# Patient Record
Sex: Female | Born: 1935
Health system: Southern US, Community
[De-identification: ages and names within clinical notes are randomized; demographics above are authoritative.]

## PROBLEM LIST (undated history)

## (undated) DIAGNOSIS — G459 Transient cerebral ischemic attack, unspecified: Secondary | ICD-10-CM

## (undated) DIAGNOSIS — E7800 Pure hypercholesterolemia, unspecified: Secondary | ICD-10-CM

## (undated) DIAGNOSIS — I10 Essential (primary) hypertension: Secondary | ICD-10-CM

## (undated) DIAGNOSIS — K219 Gastro-esophageal reflux disease without esophagitis: Secondary | ICD-10-CM

## (undated) HISTORY — PX: HIP SURGERY: SHX245

## (undated) HISTORY — PX: ABDOMINAL HYSTERECTOMY: SHX81

---

## 2004-12-10 ENCOUNTER — Ambulatory Visit: Payer: Self-pay | Admitting: Internal Medicine

## 2005-01-27 ENCOUNTER — Encounter: Payer: Self-pay | Admitting: Internal Medicine

## 2005-02-08 ENCOUNTER — Encounter: Payer: Self-pay | Admitting: Internal Medicine

## 2005-03-11 ENCOUNTER — Encounter: Payer: Self-pay | Admitting: Internal Medicine

## 2005-12-14 ENCOUNTER — Ambulatory Visit: Payer: Self-pay | Admitting: Internal Medicine

## 2006-12-21 ENCOUNTER — Ambulatory Visit: Payer: Self-pay | Admitting: Internal Medicine

## 2007-12-22 ENCOUNTER — Ambulatory Visit: Payer: Self-pay | Admitting: Internal Medicine

## 2008-12-24 ENCOUNTER — Ambulatory Visit: Payer: Self-pay | Admitting: Internal Medicine

## 2009-08-14 ENCOUNTER — Inpatient Hospital Stay: Payer: Self-pay | Admitting: Surgery

## 2009-10-14 ENCOUNTER — Ambulatory Visit: Payer: Self-pay | Admitting: Ophthalmology

## 2009-10-28 ENCOUNTER — Ambulatory Visit: Payer: Self-pay | Admitting: Ophthalmology

## 2009-12-10 ENCOUNTER — Ambulatory Visit: Payer: Self-pay | Admitting: Ophthalmology

## 2009-12-23 ENCOUNTER — Ambulatory Visit: Payer: Self-pay | Admitting: Ophthalmology

## 2009-12-30 ENCOUNTER — Ambulatory Visit: Payer: Self-pay | Admitting: Internal Medicine

## 2010-05-22 ENCOUNTER — Inpatient Hospital Stay: Payer: Self-pay | Admitting: Internal Medicine

## 2011-02-16 ENCOUNTER — Ambulatory Visit: Payer: Self-pay | Admitting: Internal Medicine

## 2012-03-02 ENCOUNTER — Ambulatory Visit: Payer: Self-pay | Admitting: Internal Medicine

## 2014-08-17 ENCOUNTER — Ambulatory Visit
Admit: 2014-08-17 | Disposition: A | Discharge status: Home / Self Care | Payer: Self-pay | Attending: Internal Medicine | Admitting: Internal Medicine

## 2015-05-31 DIAGNOSIS — I739 Peripheral vascular disease, unspecified: Secondary | ICD-10-CM | POA: Diagnosis not present

## 2015-05-31 DIAGNOSIS — I10 Essential (primary) hypertension: Secondary | ICD-10-CM | POA: Diagnosis not present

## 2015-05-31 DIAGNOSIS — E7800 Pure hypercholesterolemia, unspecified: Secondary | ICD-10-CM | POA: Diagnosis not present

## 2015-05-31 DIAGNOSIS — Z79899 Other long term (current) drug therapy: Secondary | ICD-10-CM | POA: Diagnosis not present

## 2015-05-31 DIAGNOSIS — D519 Vitamin B12 deficiency anemia, unspecified: Secondary | ICD-10-CM | POA: Diagnosis not present

## 2015-05-31 DIAGNOSIS — N390 Urinary tract infection, site not specified: Secondary | ICD-10-CM | POA: Diagnosis not present

## 2015-06-13 DIAGNOSIS — I10 Essential (primary) hypertension: Secondary | ICD-10-CM | POA: Diagnosis not present

## 2015-06-27 DIAGNOSIS — I739 Peripheral vascular disease, unspecified: Secondary | ICD-10-CM | POA: Diagnosis not present

## 2015-06-27 DIAGNOSIS — I10 Essential (primary) hypertension: Secondary | ICD-10-CM | POA: Diagnosis not present

## 2015-08-08 DIAGNOSIS — I10 Essential (primary) hypertension: Secondary | ICD-10-CM | POA: Diagnosis not present

## 2015-08-08 DIAGNOSIS — D519 Vitamin B12 deficiency anemia, unspecified: Secondary | ICD-10-CM | POA: Diagnosis not present

## 2015-08-08 DIAGNOSIS — I739 Peripheral vascular disease, unspecified: Secondary | ICD-10-CM | POA: Diagnosis not present

## 2015-11-27 DIAGNOSIS — Z0000 Encounter for general adult medical examination without abnormal findings: Secondary | ICD-10-CM | POA: Diagnosis not present

## 2015-11-27 DIAGNOSIS — I739 Peripheral vascular disease, unspecified: Secondary | ICD-10-CM | POA: Diagnosis not present

## 2015-11-27 DIAGNOSIS — D519 Vitamin B12 deficiency anemia, unspecified: Secondary | ICD-10-CM | POA: Diagnosis not present

## 2015-11-27 DIAGNOSIS — Z79899 Other long term (current) drug therapy: Secondary | ICD-10-CM | POA: Diagnosis not present

## 2015-11-27 DIAGNOSIS — E7800 Pure hypercholesterolemia, unspecified: Secondary | ICD-10-CM | POA: Diagnosis not present

## 2015-11-27 DIAGNOSIS — K219 Gastro-esophageal reflux disease without esophagitis: Secondary | ICD-10-CM | POA: Diagnosis not present

## 2015-11-27 DIAGNOSIS — E538 Deficiency of other specified B group vitamins: Secondary | ICD-10-CM | POA: Diagnosis not present

## 2015-11-27 DIAGNOSIS — I10 Essential (primary) hypertension: Secondary | ICD-10-CM | POA: Diagnosis not present

## 2016-01-10 DIAGNOSIS — I10 Essential (primary) hypertension: Secondary | ICD-10-CM | POA: Diagnosis not present

## 2016-02-27 DIAGNOSIS — D519 Vitamin B12 deficiency anemia, unspecified: Secondary | ICD-10-CM | POA: Diagnosis not present

## 2016-02-27 DIAGNOSIS — I10 Essential (primary) hypertension: Secondary | ICD-10-CM | POA: Diagnosis not present

## 2016-02-27 DIAGNOSIS — Z79899 Other long term (current) drug therapy: Secondary | ICD-10-CM | POA: Diagnosis not present

## 2016-04-14 DIAGNOSIS — Z961 Presence of intraocular lens: Secondary | ICD-10-CM | POA: Diagnosis not present

## 2016-06-22 DIAGNOSIS — Z79899 Other long term (current) drug therapy: Secondary | ICD-10-CM | POA: Diagnosis not present

## 2016-06-22 DIAGNOSIS — I10 Essential (primary) hypertension: Secondary | ICD-10-CM | POA: Diagnosis not present

## 2016-06-22 DIAGNOSIS — I2510 Atherosclerotic heart disease of native coronary artery without angina pectoris: Secondary | ICD-10-CM | POA: Diagnosis not present

## 2016-06-22 DIAGNOSIS — R42 Dizziness and giddiness: Secondary | ICD-10-CM | POA: Diagnosis not present

## 2016-06-22 DIAGNOSIS — E7800 Pure hypercholesterolemia, unspecified: Secondary | ICD-10-CM | POA: Diagnosis not present

## 2016-09-22 DIAGNOSIS — I10 Essential (primary) hypertension: Secondary | ICD-10-CM | POA: Diagnosis not present

## 2016-09-22 DIAGNOSIS — E7800 Pure hypercholesterolemia, unspecified: Secondary | ICD-10-CM | POA: Diagnosis not present

## 2016-09-22 DIAGNOSIS — D519 Vitamin B12 deficiency anemia, unspecified: Secondary | ICD-10-CM | POA: Diagnosis not present

## 2016-12-23 DIAGNOSIS — Z79899 Other long term (current) drug therapy: Secondary | ICD-10-CM | POA: Diagnosis not present

## 2016-12-23 DIAGNOSIS — I739 Peripheral vascular disease, unspecified: Secondary | ICD-10-CM | POA: Diagnosis not present

## 2016-12-23 DIAGNOSIS — E7800 Pure hypercholesterolemia, unspecified: Secondary | ICD-10-CM | POA: Diagnosis not present

## 2016-12-23 DIAGNOSIS — K219 Gastro-esophageal reflux disease without esophagitis: Secondary | ICD-10-CM | POA: Diagnosis not present

## 2016-12-23 DIAGNOSIS — I10 Essential (primary) hypertension: Secondary | ICD-10-CM | POA: Diagnosis not present

## 2017-01-11 DIAGNOSIS — N390 Urinary tract infection, site not specified: Secondary | ICD-10-CM | POA: Diagnosis not present

## 2017-01-11 DIAGNOSIS — R300 Dysuria: Secondary | ICD-10-CM | POA: Diagnosis not present

## 2017-01-26 DIAGNOSIS — R300 Dysuria: Secondary | ICD-10-CM | POA: Diagnosis not present

## 2017-01-27 DIAGNOSIS — R8290 Unspecified abnormal findings in urine: Secondary | ICD-10-CM | POA: Diagnosis not present

## 2017-02-04 ENCOUNTER — Emergency Department: Payer: PPO

## 2017-02-04 ENCOUNTER — Observation Stay
Admission: EM | Admit: 2017-02-04 | Discharge: 2017-02-05 | Disposition: A | Discharge status: Home / Self Care | Payer: PPO | Attending: Internal Medicine | Admitting: Internal Medicine

## 2017-02-04 ENCOUNTER — Encounter: Payer: Self-pay | Admitting: Emergency Medicine

## 2017-02-04 DIAGNOSIS — W19XXXA Unspecified fall, initial encounter: Secondary | ICD-10-CM | POA: Insufficient documentation

## 2017-02-04 DIAGNOSIS — Z961 Presence of intraocular lens: Secondary | ICD-10-CM | POA: Insufficient documentation

## 2017-02-04 DIAGNOSIS — R55 Syncope and collapse: Principal | ICD-10-CM | POA: Insufficient documentation

## 2017-02-04 DIAGNOSIS — E7800 Pure hypercholesterolemia, unspecified: Secondary | ICD-10-CM | POA: Insufficient documentation

## 2017-02-04 DIAGNOSIS — Z23 Encounter for immunization: Secondary | ICD-10-CM | POA: Insufficient documentation

## 2017-02-04 DIAGNOSIS — Z79899 Other long term (current) drug therapy: Secondary | ICD-10-CM | POA: Diagnosis not present

## 2017-02-04 DIAGNOSIS — I10 Essential (primary) hypertension: Secondary | ICD-10-CM | POA: Diagnosis not present

## 2017-02-04 DIAGNOSIS — E785 Hyperlipidemia, unspecified: Secondary | ICD-10-CM | POA: Diagnosis not present

## 2017-02-04 DIAGNOSIS — Z8673 Personal history of transient ischemic attack (TIA), and cerebral infarction without residual deficits: Secondary | ICD-10-CM | POA: Insufficient documentation

## 2017-02-04 DIAGNOSIS — R001 Bradycardia, unspecified: Secondary | ICD-10-CM | POA: Diagnosis not present

## 2017-02-04 DIAGNOSIS — Z7902 Long term (current) use of antithrombotics/antiplatelets: Secondary | ICD-10-CM | POA: Insufficient documentation

## 2017-02-04 HISTORY — DX: Essential (primary) hypertension: I10

## 2017-02-04 HISTORY — DX: Pure hypercholesterolemia, unspecified: E78.00

## 2017-02-04 HISTORY — DX: Transient cerebral ischemic attack, unspecified: G45.9

## 2017-02-04 LAB — BASIC METABOLIC PANEL WITH GFR
Anion gap: 12 (ref 5–15)
BUN: 24 mg/dL — ABNORMAL HIGH (ref 6–20)
CO2: 24 mmol/L (ref 22–32)
Calcium: 9.3 mg/dL (ref 8.9–10.3)
Chloride: 101 mmol/L (ref 101–111)
Creatinine, Ser: 1.34 mg/dL — ABNORMAL HIGH (ref 0.44–1.00)
GFR calc Af Amer: 39 mL/min — ABNORMAL LOW
GFR calc non Af Amer: 34 mL/min — ABNORMAL LOW
Glucose, Bld: 171 mg/dL — ABNORMAL HIGH (ref 65–99)
Potassium: 3.8 mmol/L (ref 3.5–5.1)
Sodium: 137 mmol/L (ref 135–145)

## 2017-02-04 LAB — TROPONIN I
TROPONIN I: 0.03 ng/mL — AB (ref <0.03)
Troponin I: 0.03 ng/mL (ref <0.03)

## 2017-02-04 LAB — URINALYSIS, COMPLETE (UACMP) WITH MICROSCOPIC
BILIRUBIN URINE: NEGATIVE
Bacteria, UA: NONE SEEN
Glucose, UA: NEGATIVE mg/dL
Ketones, ur: NEGATIVE mg/dL
LEUKOCYTES UA: NEGATIVE
Nitrite: NEGATIVE
PH: 7 (ref 5.0–8.0)
Protein, ur: NEGATIVE mg/dL
SPECIFIC GRAVITY, URINE: 1.008 (ref 1.005–1.030)

## 2017-02-04 LAB — CBC
HCT: 37.7 % (ref 35.0–47.0)
Hemoglobin: 12.7 g/dL (ref 12.0–16.0)
MCH: 28.2 pg (ref 26.0–34.0)
MCHC: 33.6 g/dL (ref 32.0–36.0)
MCV: 84.1 fL (ref 80.0–100.0)
PLATELETS: 228 10*3/uL (ref 150–440)
RBC: 4.48 MIL/uL (ref 3.80–5.20)
RDW: 15.1 % — ABNORMAL HIGH (ref 11.5–14.5)
WBC: 12.4 10*3/uL — AB (ref 3.6–11.0)

## 2017-02-04 MED ORDER — PRAVASTATIN SODIUM 40 MG PO TABS
40.0000 mg | ORAL_TABLET | Freq: Every day | ORAL | Status: DC
  Administered 2017-02-04 – 2017-02-05 (×2): 40 mg via ORAL
  Filled 2017-02-04 (×2): qty 1

## 2017-02-04 MED ORDER — HYDROCHLOROTHIAZIDE 12.5 MG PO CAPS
12.5000 mg | ORAL_CAPSULE | Freq: Every day | ORAL | Status: DC
  Administered 2017-02-04 – 2017-02-05 (×2): 12.5 mg via ORAL
  Filled 2017-02-04 (×2): qty 1

## 2017-02-04 MED ORDER — BENAZEPRIL HCL 20 MG PO TABS
20.0000 mg | ORAL_TABLET | Freq: Every day | ORAL | Status: DC
  Administered 2017-02-04 – 2017-02-05 (×2): 20 mg via ORAL
  Filled 2017-02-04 (×2): qty 1

## 2017-02-04 MED ORDER — ACETAMINOPHEN 325 MG PO TABS: 650.0000 mg | ORAL_TABLET | Freq: Four times a day (QID) | ORAL | Status: DC | PRN

## 2017-02-04 MED ORDER — INFLUENZA VAC SPLIT HIGH-DOSE 0.5 ML IM SUSY
0.5000 mL | PREFILLED_SYRINGE | INTRAMUSCULAR | Status: AC
  Administered 2017-02-05: 0.5 mL via INTRAMUSCULAR
  Filled 2017-02-04 (×2): qty 0.5

## 2017-02-04 MED ORDER — ONDANSETRON HCL 4 MG/2ML IJ SOLN: 4.0000 mg | Freq: Four times a day (QID) | INTRAMUSCULAR | Status: DC | PRN

## 2017-02-04 MED ORDER — PANTOPRAZOLE SODIUM 40 MG PO TBEC
40.0000 mg | DELAYED_RELEASE_TABLET | Freq: Every day | ORAL | Status: DC
  Administered 2017-02-04 – 2017-02-05 (×2): 40 mg via ORAL
  Filled 2017-02-04 (×2): qty 1

## 2017-02-04 MED ORDER — ONDANSETRON HCL 4 MG PO TABS: 4.0000 mg | ORAL_TABLET | Freq: Four times a day (QID) | ORAL | Status: DC | PRN

## 2017-02-04 MED ORDER — HYDRALAZINE HCL 20 MG/ML IJ SOLN
10.0000 mg | Freq: Four times a day (QID) | INTRAMUSCULAR | Status: DC | PRN
  Administered 2017-02-04: 10 mg via INTRAVENOUS
  Filled 2017-02-04: qty 1

## 2017-02-04 MED ORDER — CLOPIDOGREL BISULFATE 75 MG PO TABS
75.0000 mg | ORAL_TABLET | Freq: Every day | ORAL | Status: DC
  Administered 2017-02-04: 75 mg via ORAL
  Filled 2017-02-04 (×2): qty 1

## 2017-02-04 MED ORDER — BENAZEPRIL-HYDROCHLOROTHIAZIDE 20-12.5 MG PO TABS: 1.0000 | ORAL_TABLET | Freq: Every day | ORAL | Status: DC

## 2017-02-04 MED ORDER — ACETAMINOPHEN 650 MG RE SUPP: 650.0000 mg | Freq: Four times a day (QID) | RECTAL | Status: DC | PRN

## 2017-02-04 MED ORDER — AMLODIPINE BESYLATE 5 MG PO TABS
5.0000 mg | ORAL_TABLET | Freq: Every day | ORAL | Status: DC
  Administered 2017-02-04 – 2017-02-05 (×2): 5 mg via ORAL
  Filled 2017-02-04 (×2): qty 1

## 2017-02-04 MED ORDER — ENOXAPARIN SODIUM 30 MG/0.3ML ~~LOC~~ SOLN
30.0000 mg | SUBCUTANEOUS | Status: DC
  Administered 2017-02-04: 30 mg via SUBCUTANEOUS
  Filled 2017-02-04: qty 0.3

## 2017-02-04 NOTE — ED Provider Notes (Signed)
[facility] Emergency Department Provider Note  Time seen: 5:03 PM  I have reviewed the triage vital signs and the nursing notes.   HISTORY  Chief Complaint Loss of Consciousness    HPI Teresa Wood is a 83 y.o. female With a past medical history of TIAs, hypertension, presents to the emergency department after a syncopal episode this morning. According to the patient she states she awoke around 2:00 this morning and felt like her heart was beating abnormally. Patient states she cannot really describe what it felt like it was not beating normal per patient. States she went back to sleep however upon awakening around 8:00 this morning she was in the kitchen making her breakfast She remembers is awakening on the ground. He does not recall any chest pain dizziness nausea or shortness of breath. Patient states throughout the day she has been feeling fairly well however when family came over later they stated she needed to go to the emergency department so they brought her here for evaluation. Patient denies any chest pain shortness of breath nausea or diaphoresis at any point. Patient denies any history of syncopal episodes in the past.  Past Medical History:  Diagnosis Date  . Elevated cholesterol   . Hypertension   . TIA (transient ischemic attack)     There are no active problems to display for this patient.   No past surgical history on file.  Prior to Admission medications   Medication Sig Start Date End Date Taking? Authorizing Provider  amLODipine (NORVASC) 5 MG tablet Take 5 mg by mouth daily.   Yes [provider]  benazepril-hydrochlorthiazide (LOTENSIN HCT) 20-12.5 MG tablet Take 1 tablet by mouth daily.   Yes [provider]  clopidogrel (PLAVIX) 75 MG tablet Take 75 mg by mouth daily.   Yes [provider]  pantoprazole (PROTONIX) 40 MG tablet Take 40 mg by mouth daily.   Yes [provider]  pravastatin  (PRAVACHOL) 40 MG tablet Take 40 mg by mouth daily.   Yes [provider]    No Known Allergies  No family history on file.  Social History Social History  Substance Use Topics  . Smoking status: Not on file  . Smokeless tobacco: Not on file  . Alcohol use Not on file    Review of Systems Constitutional: Negative for fever. Cardiovascular: Negative for chest pain.positive for feeling like her heart was beating abnormally this morning. Respiratory: Negative for shortness of breath. Gastrointestinal: Negative for abdominal pain Musculoskeletal: Negative for back pain. Neurological: Negative for headache All other ROS negative  ____________________________________________   PHYSICAL EXAM:  VITAL SIGNS: ED Triage Vitals [02/04/17 1450]  Enc Vitals Group     BP (!) 160/82     Pulse Rate 90     Resp 18     Temp 97.9 F (36.6 C)     Temp Source Oral     SpO2 99 %     Weight 120 lb (54.4 kg)     Height 5\' 1"  (1.549 m)     Head Circumference      Peak Flow      Pain Score      Pain Loc      Pain Edu?      Excl. in [place]?     Constitutional: Alert and oriented. Well appearing and in no distress. Eyes: Normal exam ENT   Head: Normocephalic and atraumatic.   Mouth/Throat: Mucous membranes are moist. Cardiovascular: Normal rate, regular  rhythm. No murmur Respiratory: Normal respiratory effort without tachypnea nor retractions. Breath sounds are clear Gastrointestinal: Soft and nontender. No distention. Musculoskeletal: Nontender with normal range of motion in all extremities. No lower extremity tenderness or edema. Neurologic:  Normal speech and language. No gross focal neurologic deficits  Skin:  Skin is warm, dry and intact.  Psychiatric: Mood and affect are normal.  ____________________________________________    EKG  EKG reviewed and interpreted by myself shows sinus bradycardia at 55 bpm, narrow QRS, left axis deviation, normal intervals with no  concerning ST changes.  ____________________________________________    RADIOLOGY  CT head negative  ____________________________________________   INITIAL IMPRESSION / ASSESSMENT AND PLAN / ED COURSE  Pertinent labs & imaging results that were available during my care of the patient were reviewed by me and considered in my medical decision making (see chart for details).  patient presents to the emergency department with a possible syncopal episode this morning. Differential this time would include syncope due to arrhythmia, syncope due to vasovagal/orthostatic, dehydration, less likely ACS, less likely PE. Patient is not sure if she hit her head or not. Head CT is negative. Patient's labs are largely at her baseline. Overall the patient appears very well. No complaints at this time. Patient is a great historian.however during the examination the patient's heart rate maintains in the 70s or 80s but will drop as low as 40 bpm at times before quickly rebounding back to 70 or 80. Patient denies any symptoms when this occurs however these episodes of bradycardia could have been the cause of the patient's syncope this morning. Patient does take amlodipine which could possibly lead to bradycardia however it is atypical to have such brief episodes of bradycardia. As the patient has no history of syncope previously, believe the patient should be admitted to the hospital for further monitoring and treatment.  ____________________________________________   FINAL CLINICAL IMPRESSION(S) / ED DIAGNOSES  syncope  symptom bradycardia    Harvest Dark, MD 02/04/17 1730

## 2017-02-04 NOTE — ED Notes (Signed)
ED Provider at bedside. 

## 2017-02-04 NOTE — Progress Notes (Signed)
Pharmacist - Prescriber Communication  Enoxaparin dose modified from 40 mg subcutaneously once daily to 30 mg subcutaneously once daily due to CrCl less than 30 mL/min.   Maytal Mijangos A. [place], [place].D., BCPS Clinical Pharmacist 02/04/2017 20:18

## 2017-02-04 NOTE — H&P (Signed)
[place] [facility] [place] NAME: Teresa Wood    MR#:  [id number]  DATE OF BIRTH:  23-May-1923  DATE OF ADMISSION:  02/04/2017  PRIMARY CARE PHYSICIAN: Idelle Crouch, MD   REQUESTING/REFERRING PHYSICIAN: Dr. Harvest Dark  CHIEF COMPLAINT:   Chief Complaint  Patient presents with  . Loss of Consciousness    HISTORY OF PRESENT ILLNESS:  Teresa Wood  is a 83 y.o. female with a known history of Hypertension, previous history of TIAs, hyperlipidemia who presents to the hospital due to a syncopal episode. Patient says that she was in a usual state of health until around 2:00 this morning she woke up and felt like her heart was not beating well. She had no other symptoms of diaphoresis, chest pain, shortness of breath. She was able to fall back asleep. She woke up this morning around 8:00 and was walking in her kitchen when she fell straight to the ground backwards. She cannot recall the events and did not have any prodromal symptoms prior to her fall/syncope. Patient denied any chest pain, shortness of breath, nausea, vomiting, palpitations, diaphoresis, melena, hematochezia or any other associated symptoms presently. While the patient was in the emergency room she was noted to have episodes of bradycardia with heart rates going down to as low in the 30s. Her heart rate would quickly recovered by itself and currently she is in a normal sinus rhythm with heart rates in the 70s to 80s. Hospitalist services were contacted further treatment and evaluation.  PAST MEDICAL HISTORY:   Past Medical History:  Diagnosis Date  . Elevated cholesterol   . Hypertension   . TIA (transient ischemic attack)     PAST SURGICAL HISTORY:  No past surgical history on file.  SOCIAL HISTORY:   Social History  Substance Use Topics  . Smoking status: Not on file  . Smokeless tobacco: Not on file  . Alcohol use Not on file    FAMILY HISTORY:  No family history  on file.  DRUG ALLERGIES:  No Known Allergies  REVIEW OF SYSTEMS:   Review of Systems  Constitutional: Negative for fever and weight loss.  HENT: Negative for congestion, nosebleeds and tinnitus.   Eyes: Negative for blurred vision, double vision and redness.  Respiratory: Negative for cough, hemoptysis and shortness of breath.   Cardiovascular: Negative for chest pain, orthopnea, leg swelling and PND.  Gastrointestinal: Negative for abdominal pain, diarrhea, melena, nausea and vomiting.  Genitourinary: Negative for dysuria, hematuria and urgency.  Musculoskeletal: Negative for falls and joint pain.  Neurological: Negative for dizziness, tingling, sensory change, focal weakness, seizures, weakness and headaches.  Endo/Heme/Allergies: Negative for polydipsia. Does not bruise/bleed easily.  Psychiatric/Behavioral: Negative for depression and memory loss. The patient is not nervous/anxious.     MEDICATIONS AT HOME:   Prior to Admission medications   Medication Sig Start Date End Date Taking? Authorizing Provider  amLODipine (NORVASC) 5 MG tablet Take 5 mg by mouth daily.   Yes [provider]  benazepril-hydrochlorthiazide (LOTENSIN HCT) 20-12.5 MG tablet Take 1 tablet by mouth daily.   Yes [provider]  clopidogrel (PLAVIX) 75 MG tablet Take 75 mg by mouth daily.   Yes [provider]  pantoprazole (PROTONIX) 40 MG tablet Take 40 mg by mouth daily.   Yes [provider]  pravastatin (PRAVACHOL) 40 MG tablet Take 40 mg by mouth daily.   Yes [provider]      VITAL SIGNS:  Blood pressure (!) 197/68, pulse 93, temperature 97.9 F (36.6 C), temperature source Oral, resp. rate 18, height 5\' 1"  (1.549 m), weight 54.4 kg (120 lb), SpO2 97 %.  PHYSICAL EXAMINATION:  Physical Exam  GENERAL:  83 y.o.-year-old patient lying in the bed in no acute distress.  EYES: Pupils equal, round, reactive to light and accommodation. No scleral  icterus. Extraocular muscles intact.  HEENT: Head atraumatic, normocephalic. Oropharynx and nasopharynx clear. No oropharyngeal erythema, moist oral mucosa  NECK:  Supple, no jugular venous distention. No thyroid enlargement, no tenderness.  LUNGS: Normal breath sounds bilaterally, no wheezing, rales, rhonchi. No use of accessory muscles of respiration.  CARDIOVASCULAR: S1, S2 RRR. No murmurs, rubs, gallops, clicks.  ABDOMEN: Soft, nontender, nondistended. Bowel sounds present. No organomegaly or mass.  EXTREMITIES: No pedal edema, cyanosis, or clubbing. + 2 pedal & radial pulses b/l.   NEUROLOGIC: Cranial nerves II through XII are intact. No focal Motor or sensory deficits appreciated b/l PSYCHIATRIC: The patient is alert and oriented x 3.  SKIN: No obvious rash, lesion, or ulcer.   LABORATORY PANEL:   CBC  Recent Labs Lab 02/04/17 1500  WBC 12.4*  HGB 12.7  HCT 37.7  PLT 228   ------------------------------------------------------------------------------------------------------------------  Chemistries   Recent Labs Lab 02/04/17 1500  NA 137  K 3.8  CL 101  CO2 24  GLUCOSE 171*  BUN 24*  CREATININE 1.34*  CALCIUM 9.3   ------------------------------------------------------------------------------------------------------------------  Cardiac Enzymes  Recent Labs Lab 02/04/17 1500  TROPONINI 0.03*   ------------------------------------------------------------------------------------------------------------------  RADIOLOGY:  Ct Head Wo Contrast  Result Date: 02/04/2017 CLINICAL DATA:  Syncopal episode while making coffee this morning. Fell dizzy last night. History of hypertension and hypercholesterolemia. EXAM: CT HEAD WITHOUT CONTRAST TECHNIQUE: Contiguous axial images were obtained from the base of the skull through the vertex without intravenous contrast. COMPARISON:  CT HEAD August 17, 2014 FINDINGS: BRAIN: No intraparenchymal hemorrhage, mass effect nor  midline shift. Old LEFT basal ganglia infarct with ex vacuo dilatation LEFT lateral ventricle, ventricles and sulci are overall normal for patient's age. Patchy to comp supratentorial white matter hypodensities are similar. No abnormal extra-axial fluid collections. Basal cisterns are patent. VASCULAR: Moderate calcific atherosclerosis of the carotid siphons. SKULL: No skull fracture. Moderate temporomandibular osteoarthrosis. Partially imaged atlantooccipital assimilation. No significant scalp soft tissue swelling. SINUSES/ORBITS: The mastoid air-cells and included paranasal sinuses are well-aerated.The included ocular globes and orbital contents are non-suspicious. Status post bilateral ocular lens implants. OTHER: None. IMPRESSION: 1. No acute intracranial process. 2. Old LEFT basal ganglia infarct. Moderate chronic small vessel ischemic disease. Electronically Signed   By: Elon Alas M.D.   On: 02/04/2017 15:37     IMPRESSION AND PLAN:   83 year old female with past medical history of hypertension, hyperlipidemia, his history of TIAs who presents to the hospital after a syncopal episode.  1. Syncope-etiology unclear but suspected to be cardiogenic syncope. Patient did have some relative bradycardia in the ER but it has quickly recovered on its own. Patient is not on any rate controlling meds. -We'll observe on telemetry, cycle her cardiac markers, check echocardiogram. Get cardiology consult. Patient may benefit from a Holter/monitor as an outpatient. -We'll also check orthostatic vital signs.  2. Accelerated hypertension-patient's systolic blood pressures in the ER over 190. I will continue her home medications including amlodipine, benazepril/HCTZ. I will also add some IV hydralazine as needed.  3. History of previous CVA/TIA-continue Plavix, Pravachol.  4. GERD-continue Protonix.  5. Hyperlipidemia-continue Pravachol.    All  the records are reviewed and case discussed with ED  provider. Management plans discussed with the patient, family and they are in agreement.  CODE STATUS: Full code  TOTAL TIME TAKING CARE OF THIS PATIENT: 45 minutes.    Henreitta Leber M.D on 02/04/2017 at 6:37 PM  Between 7am to 6pm - Pager - [phone]  After 6pm go to [email or web address] - password [email or web address] [place] [facility]  Office  [phone]  CC: Primary care physician; Idelle Crouch, MD

## 2017-02-04 NOTE — ED Notes (Signed)
Pt eating dinner tray  Family with pt.   

## 2017-02-04 NOTE — ED Notes (Signed)
Iv started    Unable to void at this time.  Pt alert.  Family with pt.   Pt waiting on admission

## 2017-02-04 NOTE — ED Triage Notes (Signed)
Pt reports she woke up at 0200 today because her heart beat didn't feel normal. Pt reports woke back up around 0800 to fix a cup of coffee and woke up on the floor. Pt doesn't remember what happened. Pt reports recently when she lays down at night she feels dizzy. Pt alert and oriented in triage, speech clear. Denies pain. Pt states she does not know if she hit her head. Pt takes Plavix.

## 2017-02-04 NOTE — ED Notes (Signed)
Report called to crystal rn floor nurse.

## 2017-02-04 NOTE — ED Notes (Signed)
primedoc with pt. 

## 2017-02-04 NOTE — ED Notes (Signed)
Pt reports she passed out this morning at home, came to, laid on the sofa awhile,  And then called family.  Family brought pt to er for eval.  Pt alert.  Lives alone.  Pt denies chest pain, sob.  No n/v/d.  No dizziness.  Denies bloody stools.  Pt alert.  Speech clear.  Family with pt.

## 2017-02-05 ENCOUNTER — Observation Stay: Admit: 2017-02-05 | Payer: PPO

## 2017-02-05 DIAGNOSIS — I495 Sick sinus syndrome: Secondary | ICD-10-CM | POA: Diagnosis not present

## 2017-02-05 DIAGNOSIS — R001 Bradycardia, unspecified: Secondary | ICD-10-CM | POA: Diagnosis not present

## 2017-02-05 DIAGNOSIS — E785 Hyperlipidemia, unspecified: Secondary | ICD-10-CM | POA: Diagnosis not present

## 2017-02-05 DIAGNOSIS — R55 Syncope and collapse: Secondary | ICD-10-CM | POA: Diagnosis not present

## 2017-02-05 DIAGNOSIS — I10 Essential (primary) hypertension: Secondary | ICD-10-CM | POA: Diagnosis not present

## 2017-02-05 LAB — CBC
HEMATOCRIT: 31.3 % — AB (ref 35.0–47.0)
HEMOGLOBIN: 10.9 g/dL — AB (ref 12.0–16.0)
MCH: 28.8 pg (ref 26.0–34.0)
MCHC: 34.8 g/dL (ref 32.0–36.0)
MCV: 83 fL (ref 80.0–100.0)
PLATELETS: 189 10*3/uL (ref 150–440)
RBC: 3.77 MIL/uL — AB (ref 3.80–5.20)
RDW: 15.2 % — ABNORMAL HIGH (ref 11.5–14.5)
WBC: 7.5 10*3/uL (ref 3.6–11.0)

## 2017-02-05 LAB — BASIC METABOLIC PANEL
ANION GAP: 6 (ref 5–15)
BUN: 20 mg/dL (ref 6–20)
CHLORIDE: 107 mmol/L (ref 101–111)
CO2: 26 mmol/L (ref 22–32)
CREATININE: 1.16 mg/dL — AB (ref 0.44–1.00)
Calcium: 8.8 mg/dL — ABNORMAL LOW (ref 8.9–10.3)
GFR calc non Af Amer: 40 mL/min — ABNORMAL LOW (ref >60)
GFR, EST AFRICAN AMERICAN: 46 mL/min — AB (ref >60)
Glucose, Bld: 114 mg/dL — ABNORMAL HIGH (ref 65–99)
POTASSIUM: 3.5 mmol/L (ref 3.5–5.1)
SODIUM: 139 mmol/L (ref 135–145)

## 2017-02-05 LAB — TROPONIN I
Troponin I: 0.03 ng/mL (ref <0.03)
Troponin I: 0.03 ng/mL (ref <0.03)

## 2017-02-05 NOTE — Discharge Instructions (Signed)
Do not take Plavix(Clopidogrel) till your Pacemaker is placed.

## 2017-02-05 NOTE — Care Management Obs Status (Signed)
[facility] NOTIFICATION   Patient Details  Name: THURLEY FRANCESCONI MRN: [id number] Date of Birth: 06-01-1923   Medicare Observation Status Notification Given:  Yes Notice signed, one given to patient and the other to HIM for scanning    Katrina Stack, RN 02/05/2017, 9:42 AM

## 2017-02-05 NOTE — Progress Notes (Signed)
Patient is discharge home in a stable condition, summary and f/u care given , verbalized understanding , left with daughter  

## 2017-02-05 NOTE — Consult Note (Signed)
[facility] Card[facility]  CARDIOLOGY CONSULT NOTE  Patient ID: Teresa Wood MRN: [id number] DOB/AGE: Apr 08, 1924 83 y.o.  Admit date: 02/04/2017 Referring Physician Hillary Bow, MD Primary Physician Fulton Reek, MD Primary Cardiologist Isaias Cowman, MD Reason for Consultation Bradycardia  HPI: Teresa Wood is a 83 year old female presenting for evaluation of syncopal episode. Patient has a history of sick sinus syndrome with bradycardia, TIA, hypertension, and hyperlipidemia. The patient states that around 8:00 yesterday morning, she was standing at the kitchen sink and fell backwards onto the floor. She stayed on the floor for no longer than an hour. When she awoke, she was able to get up and go about her daily activities. She denies hitting her head and denies any warning signs including dyspnea, lightheadedness, diaphoresis, chest pain, or palpitations. Around 2:00 that afternoon, she was taken by family members to the walk-in clinic where she was evaluated and advised to go to the emergency room. Patient states that prior to this episode she was in good health. She regularly sees her PCP but has not seen a cardiologist since 2016. The patient reports 1-2 syncopal episodes in the past, most recently occurring about a year ago, but none of which was evaluated by provider. Previous Holter monitor 09/28/2014 revealed predominant sinus bradycardia with a mean heart rate of 49 BPM, minimum 33 BPM and a maximal 96 BPM. Patient was reluctant to proceed with permanent pacemaker implantation at that time.  Upon evaluation in the emergency room, EKG showed sinus bradycardia at a rate of 55 bpm. Labs were pertinent for troponin of 0.03, hemoglobin 12.7 and hematocrit of 37.7 which has decreased to 10.9 and 31.3 respectively. Head CT showed evidence of old TIA but no acute findings. Currently, the patient reports feeling well with no complaints; telemetry shows sinus bradycardia 55 bpm.   Review of systems  complete and found to be negative unless listed above     Past Medical History:  Diagnosis Date  . Elevated cholesterol   . Hypertension   . TIA (transient ischemic attack)     No past surgical history on file.  Prescriptions Prior to Admission  Medication Sig Dispense Refill Last Dose  . amLODipine (NORVASC) 5 MG tablet Take 5 mg by mouth daily.   02/04/2017 at 0800  . benazepril-hydrochlorthiazide (LOTENSIN HCT) 20-12.5 MG tablet Take 1 tablet by mouth daily.   02/04/2017 at 0800  . clopidogrel (PLAVIX) 75 MG tablet Take 75 mg by mouth daily.   02/04/2017 at 0800  . pantoprazole (PROTONIX) 40 MG tablet Take 40 mg by mouth daily.   02/04/2017 at 0800  . pravastatin (PRAVACHOL) 40 MG tablet Take 40 mg by mouth daily.   02/03/2017 at [place] History  . Marital status: Widowed    Spouse name: N/A  . Number of children: N/A  . Years of education: N/A   Occupational History  . Not on file.   Social History Main Topics  . Smoking status: Not on file  . Smokeless tobacco: Not on file  . Alcohol use Not on file  . Drug use: Unknown  . Sexual activity: Not on file   Other Topics Concern  . Not on file   Social History Narrative  . No narrative on file    No family history on file.    Review of systems complete and found to be negative unless listed above      PHYSICAL EXAM  General: Well developed, well nourished,  in no acute distress HEENT:  Normocephalic and atramatic Neck:  No JVD. No carotid bruits upon auscultation.  Lungs: Clear bilaterally to auscultation and percussion. Heart: Bradycardic rate. Normal S1 and S2 without gallops or murmurs.  Abdomen: Bowel sounds are positive, abdomen soft and non-tender  Msk:  Back normal. Normal strength and tone for age. Extremities: No clubbing, cyanosis or edema.   Neuro: Alert and oriented X 3. Psych:  Good affect, responds appropriately  Labs:   Lab Results  Component Value Date   WBC 7.5  02/05/2017   HGB 10.9 (L) 02/05/2017   HCT 31.3 (L) 02/05/2017   MCV 83.0 02/05/2017   PLT 189 02/05/2017    Recent Labs Lab 02/05/17 0410  NA 139  K 3.5  CL 107  CO2 26  BUN 20  CREATININE 1.16*  CALCIUM 8.8*  GLUCOSE 114*   Lab Results  Component Value Date   TROPONINI 0.03 ([place]) 02/05/2017   No results found for: CHOL No results found for: HDL No results found for: LDLCALC No results found for: TRIG No results found for: CHOLHDL No results found for: LDLDIRECT    Radiology: Ct Head Wo Contrast  Result Date: 02/04/2017 CLINICAL DATA:  Syncopal episode while making coffee this morning. Fell dizzy last night. History of hypertension and hypercholesterolemia. EXAM: CT HEAD WITHOUT CONTRAST TECHNIQUE: Contiguous axial images were obtained from the base of the skull through the vertex without intravenous contrast. COMPARISON:  CT HEAD August 17, 2014 FINDINGS: BRAIN: No intraparenchymal hemorrhage, mass effect nor midline shift. Old LEFT basal ganglia infarct with ex vacuo dilatation LEFT lateral ventricle, ventricles and sulci are overall normal for patient's age. Patchy to comp supratentorial white matter hypodensities are similar. No abnormal extra-axial fluid collections. Basal cisterns are patent. VASCULAR: Moderate calcific atherosclerosis of the carotid siphons. SKULL: No skull fracture. Moderate temporomandibular osteoarthrosis. Partially imaged atlantooccipital assimilation. No significant scalp soft tissue swelling. SINUSES/ORBITS: The mastoid air-cells and included paranasal sinuses are well-aerated.The included ocular globes and orbital contents are non-suspicious. Status post bilateral ocular lens implants. OTHER: None. IMPRESSION: 1. No acute intracranial process. 2. Old LEFT basal ganglia infarct. Moderate chronic small vessel ischemic disease. Electronically Signed   By: Elon Alas M.D.   On: 02/04/2017 15:37    EKG: Sinus bradycardia 55 bpm, no ST  elevation  ASSESSMENT AND PLAN:  1. Syncope likely due to underlying bradycardia. Patient currently clinically and hemodynamically stable.  Recommendations: 1. Permanent pacemaker implantation. Patient is agreeable to this plan. Hold Plavix for 5 days prior to procedure. Pending patient's clinical course, permanent pacemaker implantation may be performed as outpatient early next week.   Clearnce Sorrel, PA-S The patient was interviewed and examined with direct supervision of Dr. Saralyn Pilar and the plan was reviewed and made and cooperation with him.  Signed: Isaias Cowman MD,PhD, [facility] 02/05/2017, 8:36 AM

## 2017-02-08 DIAGNOSIS — G451 Carotid artery syndrome (hemispheric): Secondary | ICD-10-CM | POA: Diagnosis not present

## 2017-02-08 DIAGNOSIS — I10 Essential (primary) hypertension: Secondary | ICD-10-CM | POA: Diagnosis not present

## 2017-02-08 DIAGNOSIS — I739 Peripheral vascular disease, unspecified: Secondary | ICD-10-CM | POA: Diagnosis not present

## 2017-02-08 DIAGNOSIS — I2510 Atherosclerotic heart disease of native coronary artery without angina pectoris: Secondary | ICD-10-CM | POA: Diagnosis not present

## 2017-02-08 DIAGNOSIS — E7800 Pure hypercholesterolemia, unspecified: Secondary | ICD-10-CM | POA: Diagnosis not present

## 2017-02-08 DIAGNOSIS — I459 Conduction disorder, unspecified: Secondary | ICD-10-CM | POA: Diagnosis not present

## 2017-02-08 DIAGNOSIS — R001 Bradycardia, unspecified: Secondary | ICD-10-CM | POA: Diagnosis not present

## 2017-02-08 DIAGNOSIS — I878 Other specified disorders of veins: Secondary | ICD-10-CM | POA: Diagnosis not present

## 2017-02-08 NOTE — Discharge Summary (Signed)
[facility] NAME: Teresa Wood    MR#:  [id number]  DATE OF BIRTH:  1923/07/14  DATE OF ADMISSION:  02/04/2017 ADMITTING PHYSICIAN: Henreitta Leber, MD  DATE OF DISCHARGE: 02/05/2017 12:27 PM  PRIMARY CARE PHYSICIAN: Idelle Crouch, MD   ADMISSION DIAGNOSIS:  Syncope and collapse [R55] Symptomatic bradycardia [R00.1]  DISCHARGE DIAGNOSIS:  Active Problems:   Syncope   SECONDARY DIAGNOSIS:   Past Medical History:  Diagnosis Date  . Elevated cholesterol   . Hypertension   . TIA (transient ischemic attack)      ADMITTING HISTORY  HISTORY OF PRESENT ILLNESS:  Teresa Wood  is a 83 y.o. female with a known history of Hypertension, previous history of TIAs, hyperlipidemia who presents to the hospital due to a syncopal episode. Patient says that she was in a usual state of health until around 2:00 this morning she woke up and felt like her heart was not beating well. She had no other symptoms of diaphoresis, chest pain, shortness of breath. She was able to fall back asleep. She woke up this morning around 8:00 and was walking in her kitchen when she fell straight to the ground backwards. She cannot recall the events and did not have any prodromal symptoms prior to her fall/syncope. Patient denied any chest pain, shortness of breath, nausea, vomiting, palpitations, diaphoresis, melena, hematochezia or any other associated symptoms presently. While the patient was in the emergency room she was noted to have episodes of bradycardia with heart rates going down to as low in the 30s. Her heart rate would quickly recovered by itself and currently she is in a normal sinus rhythm with heart rates in the 70s to 80s. Hospitalist services were contacted further treatment and evaluation.   HOSPITAL COURSE:   *   Syncope due to symptomatic bradycardia  *  Hypertension  *  History of CVA/TIA   patient was admitted to telemetry floor.  Did  not have any further bradycardic episodes or syncope in the hospital.  Seen by her cardiologist Dr. Ammie Dalton shows.  Recommended pacemaker.  But presently patient is on Plavix and needs to be off that for 5 days.  Cardiology recommended patient stay off Plavix and follow up with them in the office on Monday.  Discussed with patient.  Discharged home in stable condition with follow-up.  CONSULTS OBTAINED:  Treatment Team:  Isaias Cowman, MD  DRUG ALLERGIES:  No Known Allergies  DISCHARGE MEDICATIONS:   Discharge Medication List as of 02/05/2017 11:12 AM    CONTINUE these medications which have NOT CHANGED   Details  amLODipine (NORVASC) 5 MG tablet Take 5 mg by mouth daily., Historical Med    benazepril-hydrochlorthiazide (LOTENSIN HCT) 20-12.5 MG tablet Take 1 tablet by mouth daily., Historical Med    clopidogrel (PLAVIX) 75 MG tablet Take 75 mg by mouth daily., Historical Med    pantoprazole (PROTONIX) 40 MG tablet Take 40 mg by mouth daily., Historical Med    pravastatin (PRAVACHOL) 40 MG tablet Take 40 mg by mouth daily., Historical Med        Today   VITAL SIGNS:  Blood pressure (!) 144/41, pulse (!) 52, temperature 98.6 F (37 C), temperature source Oral, resp. rate 18, height 5\' 1"  (1.549 m), weight 54.4 kg (120 lb), SpO2 97 %.  I/O:  No intake or output data in the 24 hours ending 02/08/17 1445  PHYSICAL EXAMINATION:  Physical Exam  GENERAL:  83  y.o.-year-old patient lying in the bed with no acute distress.  LUNGS: Normal breath sounds bilaterally, no wheezing, rales,rhonchi or crepitation. No use of accessory muscles of respiration.  CARDIOVASCULAR: S1, S2 normal. No murmurs, rubs, or gallops.  ABDOMEN: Soft, non-tender, non-distended. Bowel sounds present. No organomegaly or mass.  NEUROLOGIC: Moves all 4 extremities. PSYCHIATRIC: The patient is alert and oriented x 3.  SKIN: No obvious rash, lesion, or ulcer.   DATA REVIEW:   CBC  Recent Labs Lab  02/05/17 0410  WBC 7.5  HGB 10.9*  HCT 31.3*  PLT 189    Chemistries   Recent Labs Lab 02/05/17 0410  NA 139  K 3.5  CL 107  CO2 26  GLUCOSE 114*  BUN 20  CREATININE 1.16*  CALCIUM 8.8*    Cardiac Enzymes  Recent Labs Lab 02/05/17 0410  TROPONINI 0.03*    Microbiology Results  No results found for this or any previous visit.  RADIOLOGY:  No results found.  Follow up with PCP in 1 week.  Management plans discussed with the patient, family and they are in agreement.  CODE STATUS:  Code Status History    Date Active Date Inactive Code Status Order ID Comments User Context   02/04/2017  8:12 PM 02/05/2017  3:32 PM Full Code [id number]  Henreitta Leber, MD Inpatient    Advance Directive Documentation     Most Recent Value  Type of Advance Directive  Healthcare Power of [place], Living will  Pre-existing out of facility DNR order (yellow form or pink MOST form)  -  "MOST" Form in Place?  -      TOTAL TIME TAKING CARE OF THIS PATIENT ON DAY OF DISCHARGE: more than 30 minutes.   Hillary Bow R M.D on 02/08/2017 at 2:45 PM  Between 7am to 6pm - Pager - [phone]  After 6pm go to [email or web address] - password [email or web address] [facility]  Office  [phone]  CC: Primary care physician; Idelle Crouch, MD  Note: This dictation was prepared with Dragon dictation along with smaller phrase technology. Any transcriptional errors that result from this process are unintentional.

## 2017-02-09 ENCOUNTER — Inpatient Hospital Stay: Payer: PPO

## 2017-02-09 ENCOUNTER — Ambulatory Visit
Admission: RE | Admit: 2017-02-09 | Discharge: 2017-02-10 | Disposition: A | Discharge status: Home / Self Care | Payer: PPO | Source: Ambulatory Visit | Attending: Cardiology | Admitting: Cardiology

## 2017-02-09 ENCOUNTER — Encounter: Payer: Self-pay | Admitting: *Deleted

## 2017-02-09 ENCOUNTER — Inpatient Hospital Stay: Payer: PPO | Admitting: Anesthesiology

## 2017-02-09 ENCOUNTER — Observation Stay: Payer: PPO

## 2017-02-09 ENCOUNTER — Encounter
Admission: RE | Disposition: A | Discharge status: Home / Self Care | Payer: Self-pay | Source: Ambulatory Visit | Attending: Cardiology

## 2017-02-09 DIAGNOSIS — Z8673 Personal history of transient ischemic attack (TIA), and cerebral infarction without residual deficits: Secondary | ICD-10-CM | POA: Diagnosis not present

## 2017-02-09 DIAGNOSIS — E7800 Pure hypercholesterolemia, unspecified: Secondary | ICD-10-CM | POA: Insufficient documentation

## 2017-02-09 DIAGNOSIS — I495 Sick sinus syndrome: Secondary | ICD-10-CM | POA: Diagnosis present

## 2017-02-09 DIAGNOSIS — K219 Gastro-esophageal reflux disease without esophagitis: Secondary | ICD-10-CM | POA: Insufficient documentation

## 2017-02-09 DIAGNOSIS — Z950 Presence of cardiac pacemaker: Secondary | ICD-10-CM | POA: Diagnosis not present

## 2017-02-09 DIAGNOSIS — Z79899 Other long term (current) drug therapy: Secondary | ICD-10-CM | POA: Diagnosis not present

## 2017-02-09 DIAGNOSIS — I10 Essential (primary) hypertension: Secondary | ICD-10-CM | POA: Insufficient documentation

## 2017-02-09 DIAGNOSIS — R001 Bradycardia, unspecified: Secondary | ICD-10-CM | POA: Diagnosis not present

## 2017-02-09 HISTORY — DX: Gastro-esophageal reflux disease without esophagitis: K21.9

## 2017-02-09 HISTORY — PX: PACEMAKER INSERTION: SHX728

## 2017-02-09 LAB — PROTIME-INR
INR: 1.01
PROTHROMBIN TIME: 13.2 s (ref 11.4–15.2)

## 2017-02-09 LAB — APTT: aPTT: 28 seconds (ref 24–36)

## 2017-02-09 SURGERY — INSERTION, CARDIAC PACEMAKER
Anesthesia: General | Laterality: Left | Wound class: Clean

## 2017-02-09 MED ORDER — GENTAMICIN SULFATE 40 MG/ML IJ SOLN
INTRAMUSCULAR | Status: AC
  Filled 2017-02-09: qty 2

## 2017-02-09 MED ORDER — FENTANYL CITRATE (PF) 100 MCG/2ML IJ SOLN: 25.0000 ug | INTRAMUSCULAR | Status: DC | PRN

## 2017-02-09 MED ORDER — PRAVASTATIN SODIUM 40 MG PO TABS
40.0000 mg | ORAL_TABLET | Freq: Every day | ORAL | Status: DC
  Filled 2017-02-09: qty 1

## 2017-02-09 MED ORDER — SODIUM CHLORIDE 0.9 % IR SOLN
Status: DC | PRN
  Administered 2017-02-09: 200 mL

## 2017-02-09 MED ORDER — ACETAMINOPHEN 10 MG/ML IV SOLN
INTRAVENOUS | Status: DC | PRN
  Administered 2017-02-09: 1000 mg via INTRAVENOUS

## 2017-02-09 MED ORDER — LIDOCAINE 1 % OPTIME INJ - NO CHARGE
INTRAMUSCULAR | Status: DC | PRN
  Administered 2017-02-09: 30 mL

## 2017-02-09 MED ORDER — BENAZEPRIL HCL 20 MG PO TABS
20.0000 mg | ORAL_TABLET | Freq: Every day | ORAL | Status: DC
  Filled 2017-02-09 (×2): qty 1

## 2017-02-09 MED ORDER — ACETAMINOPHEN 10 MG/ML IV SOLN
INTRAVENOUS | Status: AC
  Filled 2017-02-09: qty 100

## 2017-02-09 MED ORDER — EPHEDRINE SULFATE 50 MG/ML IJ SOLN
INTRAMUSCULAR | Status: DC | PRN
  Administered 2017-02-09 (×2): 10 mg via INTRAVENOUS

## 2017-02-09 MED ORDER — ONDANSETRON HCL 4 MG/2ML IJ SOLN: 4.0000 mg | Freq: Four times a day (QID) | INTRAMUSCULAR | Status: DC | PRN

## 2017-02-09 MED ORDER — FENTANYL CITRATE (PF) 100 MCG/2ML IJ SOLN
INTRAMUSCULAR | Status: DC | PRN
  Administered 2017-02-09 (×2): 25 ug via INTRAVENOUS

## 2017-02-09 MED ORDER — CEFAZOLIN SODIUM-DEXTROSE 1-4 GM/50ML-% IV SOLN
INTRAVENOUS | Status: AC
  Filled 2017-02-09: qty 50

## 2017-02-09 MED ORDER — CEFAZOLIN SODIUM-DEXTROSE 1-4 GM/50ML-% IV SOLN
1.0000 g | Freq: Once | INTRAVENOUS | Status: AC
  Administered 2017-02-09: 1 g via INTRAVENOUS

## 2017-02-09 MED ORDER — LACTATED RINGERS IV SOLN
INTRAVENOUS | Status: DC
Start: 2017-02-09 — End: 2017-02-09
  Administered 2017-02-09: 13:00:00 via INTRAVENOUS

## 2017-02-09 MED ORDER — AMLODIPINE BESYLATE 5 MG PO TABS
5.0000 mg | ORAL_TABLET | Freq: Every day | ORAL | Status: DC
  Filled 2017-02-09: qty 1

## 2017-02-09 MED ORDER — LIDOCAINE HCL (PF) 2 % IJ SOLN
INTRAMUSCULAR | Status: AC
  Filled 2017-02-09: qty 2

## 2017-02-09 MED ORDER — LACTATED RINGERS IV SOLN
INTRAVENOUS | Status: DC
  Administered 2017-02-09: 13:00:00 via INTRAVENOUS

## 2017-02-09 MED ORDER — EPHEDRINE SULFATE 50 MG/ML IJ SOLN
INTRAMUSCULAR | Status: AC
  Filled 2017-02-09: qty 1

## 2017-02-09 MED ORDER — OXYCODONE HCL 5 MG/5ML PO SOLN: 5.0000 mg | Freq: Once | ORAL | Status: DC | PRN

## 2017-02-09 MED ORDER — PANTOPRAZOLE SODIUM 40 MG PO TBEC
40.0000 mg | DELAYED_RELEASE_TABLET | Freq: Every day | ORAL | Status: DC
  Filled 2017-02-09 (×2): qty 1

## 2017-02-09 MED ORDER — ACETAMINOPHEN 325 MG PO TABS: 325.0000 mg | ORAL_TABLET | ORAL | Status: DC | PRN

## 2017-02-09 MED ORDER — DEXTROSE 5 % IV SOLN
1.0000 g | Freq: Four times a day (QID) | INTRAVENOUS | Status: AC
  Administered 2017-02-09 – 2017-02-10 (×3): 1 g via INTRAVENOUS
  Filled 2017-02-09 (×3): qty 10

## 2017-02-09 MED ORDER — PROPOFOL 10 MG/ML IV BOLUS
INTRAVENOUS | Status: DC | PRN
  Administered 2017-02-09 (×2): 20 mg via INTRAVENOUS
  Administered 2017-02-09: 30 mg via INTRAVENOUS
  Administered 2017-02-09: 20 mg via INTRAVENOUS
  Administered 2017-02-09: 30 mg via INTRAVENOUS
  Administered 2017-02-09 (×2): 20 mg via INTRAVENOUS

## 2017-02-09 MED ORDER — IOPAMIDOL (ISOVUE-300) INJECTION 61%
INTRAVENOUS | Status: DC | PRN
  Administered 2017-02-09: 10 mL via INTRAVENOUS

## 2017-02-09 MED ORDER — FENTANYL CITRATE (PF) 100 MCG/2ML IJ SOLN
INTRAMUSCULAR | Status: AC
  Filled 2017-02-09: qty 2

## 2017-02-09 MED ORDER — OXYCODONE HCL 5 MG PO TABS: 5.0000 mg | ORAL_TABLET | Freq: Once | ORAL | Status: DC | PRN

## 2017-02-09 MED ORDER — PROPOFOL 10 MG/ML IV BOLUS
INTRAVENOUS | Status: AC
  Filled 2017-02-09: qty 20

## 2017-02-09 MED ORDER — HYDROCHLOROTHIAZIDE 12.5 MG PO CAPS
12.5000 mg | ORAL_CAPSULE | Freq: Every day | ORAL | Status: DC
  Filled 2017-02-09: qty 1

## 2017-02-09 SURGICAL SUPPLY — 38 items
BAG DECANTER FOR FLEXI CONT (MISCELLANEOUS) ×3 IMPLANT
BRUSH SCRUB EZ  4% CHG (MISCELLANEOUS) ×2
BRUSH SCRUB EZ 4% CHG (MISCELLANEOUS) ×1 IMPLANT
CABLE SURG 12 DISP A/V CHANNEL (MISCELLANEOUS) ×3 IMPLANT
CANISTER SUCT 1200ML W/VALVE (MISCELLANEOUS) ×3 IMPLANT
CHLORAPREP W/TINT 26ML (MISCELLANEOUS) ×3 IMPLANT
COVER LIGHT HANDLE STERIS (MISCELLANEOUS) ×6 IMPLANT
COVER MAYO STAND STRL (DRAPES) ×3 IMPLANT
DRAPE C-ARM XRAY 36X54 (DRAPES) ×3 IMPLANT
DRSG TEGADERM 4X4.75 (GAUZE/BANDAGES/DRESSINGS) ×3 IMPLANT
DRSG TELFA 4X3 1S NADH ST (GAUZE/BANDAGES/DRESSINGS) ×3 IMPLANT
ELECT REM PT RETURN 9FT ADLT (ELECTROSURGICAL) ×3
ELECTRODE REM PT RTRN 9FT ADLT (ELECTROSURGICAL) ×1 IMPLANT
GLOVE BIO SURGEON STRL SZ7.5 (GLOVE) ×12 IMPLANT
GLOVE BIO SURGEON STRL SZ8 (GLOVE) ×9 IMPLANT
GOWN STRL REUS W/ TWL LRG LVL3 (GOWN DISPOSABLE) ×2 IMPLANT
GOWN STRL REUS W/ TWL XL LVL3 (GOWN DISPOSABLE) ×1 IMPLANT
GOWN STRL REUS W/TWL LRG LVL3 (GOWN DISPOSABLE) ×4
GOWN STRL REUS W/TWL XL LVL3 (GOWN DISPOSABLE) ×2
IMMOBILIZER SHDR MD LX WHT (SOFTGOODS) ×3 IMPLANT
IMMOBILIZER SHDR XL LX WHT (SOFTGOODS) IMPLANT
INTRO PACEMAKR LEAD 9FR 13CM (INTRODUCER) ×6
INTRO PACEMKR SHEATH II 7FR (MISCELLANEOUS) ×3
INTRODUCER PACEMKR LD 9FR 13CM (INTRODUCER) ×2 IMPLANT
INTRODUCER PACEMKR SHTH II 7FR (MISCELLANEOUS) ×1 IMPLANT
IPG PACE AZUR XT DR MRI W1DR01 (Pacemaker) ×1 IMPLANT
IV NS 500ML (IV SOLUTION) ×2
IV NS 500ML BAXH (IV SOLUTION) ×1 IMPLANT
KIT RM TURNOVER STRD PROC AR (KITS) ×3 IMPLANT
LABEL OR SOLS (LABEL) ×3 IMPLANT
LEAD CAPSURE NOVUS 5076-52CM (Lead) ×3 IMPLANT
LEAD INTRODUCER 9FR 23CM (INTRODUCER) ×3 IMPLANT
MARKER SKIN DUAL TIP RULER LAB (MISCELLANEOUS) ×3 IMPLANT
PACE AZURE XT DR MRI W1DR01 (Pacemaker) ×3 IMPLANT
PACEMAKER LEAD ATRL (Lead) ×3 IMPLANT
PACK PACE INSERTION (MISCELLANEOUS) ×3 IMPLANT
PAD ONESTEP ZOLL R SERIES ADT (MISCELLANEOUS) ×3 IMPLANT
SUT SILK 0 SH 30 (SUTURE) ×9 IMPLANT

## 2017-02-09 NOTE — Anesthesia Post-op Follow-up Note (Signed)
Anesthesia QCDR form completed.        

## 2017-02-09 NOTE — Progress Notes (Signed)
Pt arrived from recovery to room 241. VSS. Surgical site is clean, dry and intact with shoulder immobilizer in place. No complaints of pain or nausea. Heart monitored applied to pt and verified. Diet advanced to heart healthy and pt ordered dinner. Pt stated she has already taken her home medications today and does not want to take them again. Reviewed plan of care and safety precautions with pt and family. Will continue to monitor.

## 2017-02-09 NOTE — Op Note (Signed)
[facility]   02/09/2017                     3:23 PM  PATIENT:  Teresa Wood    PRE-OPERATIVE DIAGNOSIS:  sss/bradycardia  POST-OPERATIVE DIAGNOSIS:  Same  PROCEDURE:  INSERTION PACEMAKER-DUAL CHAMBER  SURGEON:  Isaias Cowman, MD    ANESTHESIA:     PREOPERATIVE INDICATIONS:  Teresa Wood is a  83 y.o. female with a diagnosis of sss/bradycardia who failed conservative measures and elected for surgical management.    The risks benefits and alternatives were discussed with the patient preoperatively including but not limited to the risks of infection, bleeding, cardiopulmonary complications, the need for revision surgery, among others, and the patient was willing to proceed.   OPERATIVE PROCEDURE: The right pectoral region was prepped and draped in the usual sterile manner. Anesthesia was obtained 1% lidocaine locally. A 6 cm incision was performed a left pectoral region. Pacemaker pocket was generated by electrocautery and blunt dissection. Access was obtained to left subclavian vein by fine needle aspiration. MRI compatible leads were positioned to right ventricular apical septum ( Medtronic [id number] ) and right atrial appendage ( Medtronic [id number] ) under fluoroscopic guidance. After proper thresholds were obtained the leads were sutured in place. The leads were connected to a MRI compatible dual-chamber rate responsive pacemaker generator ( Medtronic [id number] ). The pocket was irrigated with gentamicin solution. The pacemaker generator was positioned into the pocket and the pocket was closed with 2-0 and 4-0 Vicryl, respectively. Steri-Strips and a pressure dressing were applied. Pacemaker interrogation following the procedure revealed appropriate sensing and pacing thresholds. There were no periprocedural complications.

## 2017-02-09 NOTE — H&P (Signed)
<6>29299-5<7>Encounter Details<6>46240-8<7>Social History<6>29762-2<7>Last Filed Vital Signs<6>8716-3<7>Instructions<6>69730-0<7>Progress Notes<6>10164-2<7>Plan of Treatment<6>18776-5<7>Visit Diagnoses<6>51848-0<7>/"> Jump to Section ? Document InformationEncounter DetailsInstructionsLast Filed Vital SignsPatient DemographicsPlan of TreatmentProgress NotesReason for VisitSocial HistoryVisit Diagnoses [facility], generated on Oct. 02, 2018 Printout Information  Document Contents Office Visit Document Received Date Oct. 02, 2018 [place]   Patient Demographics - 83 y.o. Female, born 1923-06-02   Patient Address Communication Language Race / Ethnicity  [address] [place], [place] [address] [phone] [facility]) [place] (Preferred) Dema Severin / Not Hispanic or Latino  Reason for Visit    Reason Comments  [facility]up [facility] syncope-last seen 2016  Syncope   Dizziness at night when i lay down or if i get up quick    Encounter Details    Date Type Department Care Team Description  02/08/2017 Office Visit [facility]  [place], [place] [address]  [phone]  Isaias Cowman, Vernon Andrew  [facility]  [place], [place] [address]  [phone]  [phone] (Fax)  Hemispheric carotid artery syndrome (Primary Dx);  PVD (peripheral vascular disease) (CMS-HCC);  Coronary artery disease involving native coronary artery of native heart without angina pectoris;  Benign hypertension;  Pure hypercholesterolemia;  Venous stasis;  Bradycardia;  Stokes-Adams syncope   Social History - as of this encounter   Tobacco Use Types Packs/Day Years Used Date  Never Smoker      Smokeless Tobacco: Never Used      Alcohol Use Drinks/Week oz/Week Comments  No 0 Standard drinks or equivalent  0.0     Sex Assigned at Birth Date Recorded    Not on file    Last Filed Vital Signs - in this encounter   Vital Sign Reading Time Taken  Blood Pressure 120/60 02/08/2017 11:18 AM EDT  Pulse 46 02/08/2017 11:18 AM EDT  Temperature - -  Respiratory Rate - -  Oxygen Saturation - -  Inhaled Oxygen Concentration - -  Weight 53.1 kg (117 lb) 02/08/2017 11:18 AM EDT  Height 154.9 cm (5\' 1" ) 02/08/2017 11:18 AM EDT  Body Mass Index 22.11 02/08/2017 11:18 AM EDT   Instructions - in this encounter   Patient Instructions - Isaias Cowman, MD - 02/08/2017 10:45 AM EDT Formatting of this note may be different from the original.  Patient Education    DASH Diet: Care Instructions Your Care Instructions  The DASH diet is an eating plan that can help lower your blood pressure. DASH stands for Dietary Approaches to Stop Hypertension. Hypertension is high blood pressure. The DASH diet focuses on eating foods that are high in calcium, potassium, and magnesium. These nutrients can lower blood pressure. The foods that are highest in these nutrients are fruits, vegetables, low-fat dairy products, nuts, seeds, and legumes. But taking calcium, potassium, and magnesium supplements instead of eating foods that are high in those nutrients does not have the same effect. The DASH diet also includes whole grains, fish, and poultry. The DASH diet is one of several lifestyle changes your doctor may recommend to lower your high blood pressure. Your doctor may also want you to decrease the amount of sodium in your diet. Lowering sodium while following the DASH diet can lower blood pressure even further than just the DASH diet alone. Follow-up care is a key part of your treatment and safety. Be sure to make and go to all appointments, and call your doctor if you are having problems. It's also a good idea to  know your test results and keep a list of the medicines you take. How can you care for yourself at home? Following the DASH diet  Eat 4 to 5  servings of fruit each day. A serving is 1 medium-sized piece of fruit,  cup chopped or canned fruit, 1/4 cup dried fruit, or 4 ounces ( cup) of fruit juice. Choose fruit more often than fruit juice.  Eat 4 to 5 servings of vegetables each day. A serving is 1 cup of lettuce or raw leafy vegetables,  cup of chopped or cooked vegetables, or 4 ounces ( cup) of vegetable juice. Choose vegetables more often than vegetable juice.  Get 2 to 3 servings of low-fat and fat-free dairy each day. A serving is 8 ounces of milk, 1 cup of yogurt, or 1  ounces of cheese.  Eat 6 to 8 servings of grains each day. A serving is 1 slice of bread, 1 ounce of dry cereal, or  cup of cooked rice, pasta, or cooked cereal. Try to choose whole-grain products as much as possible.  Limit lean meat, poultry, and fish to 2 servings each day. A serving is 3 ounces, about the size of a deck of cards.  Eat 4 to 5 servings of nuts, seeds, and legumes (cooked dried beans, lentils, and split peas) each week. A serving is 1/3 cup of nuts, 2 tablespoons of seeds, or  cup of cooked beans or peas.  Limit fats and oils to 2 to 3 servings each day. A serving is 1 teaspoon of vegetable oil or 2 tablespoons of salad dressing.  Limit sweets and added sugars to 5 servings or less a week. A serving is 1 tablespoon jelly or jam,  cup sorbet, or 1 cup of lemonade.  Eat less than 2,300 milligrams (mg) of sodium a day. If you limit your sodium to 1,500 mg a day, you can lower your blood pressure even more. Tips for success  Start small. Do not try to make dramatic changes to your diet all at once. You might feel that you are missing out on your favorite foods and then be more likely to not follow the plan. Make small changes, and stick with them. Once those changes become habit, add a few more changes.  Try some of the following: ? Make it a goal to eat a fruit or vegetable at every meal and at snacks. This will make it easy to get the  recommended amount of fruits and vegetables each day. ? Try yogurt topped with fruit and nuts for a snack or healthy dessert. ? Add lettuce, tomato, cucumber, and onion to sandwiches. ? Combine a ready-made pizza crust with low-fat mozzarella cheese and lots of vegetable toppings. Try using tomatoes, squash, spinach, broccoli, carrots, cauliflower, and onions. ? Have a variety of cut-up vegetables with a low-fat dip as an appetizer instead of chips and dip. ? Sprinkle sunflower seeds or chopped almonds over salads. Or try adding chopped walnuts or almonds to cooked vegetables. ? Try some vegetarian meals using beans and peas. Add garbanzo or kidney beans to salads. Make burritos and tacos with mashed pinto beans or black beans. Where can you learn more? Log in to your [facility] My[facility] account at [email or web address] and click on top menu option "Health" then select "Search Medical Library". Enter [phone] in the search box and click the magnify glass to learn more about "DASH Diet: Care Instructions." Current as of: April 15, 2016 Content Version: 11.7  2006-2018 [employer], Inc[employer].  Care instructions adapted under license by your healthcare professional. If you have questions about a medical condition or this instruction, always ask your healthcare professional. [place] any warranty or liability for your use of this information.    Patient Education    High Cholesterol: Care Instructions Your Care Instructions  Cholesterol is a type of fat in your blood. It is needed for many body functions, such as making new cells. Cholesterol is made by your body. It also comes from food you eat. High cholesterol means that you have too much of the fat in your blood. This raises your risk of a heart attack and stroke. LDL and HDL are part of your total cholesterol. LDL is the "bad" cholesterol. High LDL can raise your risk for heart disease, heart attack, and stroke. HDL is the  "good" cholesterol. It helps clear bad cholesterol from the body. High HDL is linked with a lower risk of heart disease, heart attack, and stroke. Your cholesterol levels help your doctor find out your risk for having a heart attack or stroke. You and your doctor can talk about whether you need to lower your risk and what treatment is best for you. A heart-healthy lifestyle along with medicines can help lower your cholesterol and your risk. The way you choose to lower your risk will depend on how high your risk is for heart attack and stroke. It will also depend on how you feel about taking medicines. Follow-up care is a key part of your treatment and safety. Be sure to make and go to all appointments, and call your doctor if you are having problems. It's also a good idea to know your test results and keep a list of the medicines you take. How can you care for yourself at home?  Eat a variety of foods every day. Good choices include fruits, vegetables, whole grains (like oatmeal), dried beans and peas, nuts and seeds, soy products (like tofu), and fat-free or low-fat dairy products.  Replace butter, margarine, and hydrogenated or partially hydrogenated oils with olive and canola oils. (Canola oil margarine without trans fat is fine.)  Replace red meat with fish, poultry, and soy protein (like tofu).  Limit processed and packaged foods like chips, crackers, and cookies.  Bake, broil, or steam foods. Don't fry them.  Be physically active. Get at least 30 minutes of exercise on most days of the week. Walking is a good choice. You also may want to do other activities, such as running, swimming, cycling, or playing tennis or team sports.  Stay at a healthy weight or lose weight by making the changes in eating and physical activity listed above. Losing just a small amount of weight, even 5 to 10 pounds, can reduce your risk for having a heart attack or stroke.  Do not smoke. When should you call for  help? Watch closely for changes in your health, and be sure to contact your doctor if:   You need help making lifestyle changes.    You have questions about your medicine.  Where can you learn more? Log in to your [facility] My[facility] account at [email or web address] and click on top menu option "Health" then select "Search Medical Library". Enter [phone] in the search box and click the magnify glass to learn more about "High Cholesterol: Care Instructions." Current as of: Sep 18, 2015 Content Version: 11.7  2006-2018 Healthwise, Incorporated. Care instructions adapted under license by your healthcare professional. If you have questions about a medical condition  or this instruction, always ask your healthcare professional. [place] any warranty or liability for your use of this information.       Progress Notes - in this encounter   Table of Contents for Progress Notes Isaias Cowman, MD - 02/08/2017 10:45 AM EDT Richardo Priest, RN - 02/08/2017 10:45 AM EDT   Isaias Cowman, MD - 02/08/2017 10:45 AM EDT Formatting of this note may be different from the original. Established Patient Visit   Chief Complaint: Chief Complaint  Patient presents with  . Follow-up  [facility] syncope-last seen 2016  . Syncope  . Dizziness  at night when i lay down or if i get up quick  Date of Service: 02/08/2017 Date of Birth: 04-03-1924 PCP: Idelle Crouch, MD  History of Present Illness: Ms. Shipton is a 83 y.o.female patient who returns for  1. Sick sinus syndrome 2. Essential hypertension 3. Hyperlipidemia 4. Transient ischemic attack 5. Insignificant coronary artery disease by prior cardiac catheterization 6. Syncope  The patient was admitted on 02/04/2017 following a syncopal episode. EKG revealed sinus bradycardia rate of 55 bpm. Telemetry revealed persistent bradycardia at a rate of 55 bpm. Decision was made to proceed with permanent pacemaker  implantation, however the patient was on Plavix for history of TIA. She returns today, been off of Plavix for 4-5 days. She has had no recurrent syncopal episodes. Heart rate today is 46 bpm. 24 hour Holter monitor 09/28/2014 revealed sinus bradycardia, with mean heart rate of 49 beats per minute, with minimum heart rate of 33 beats per minute and maximum heart rate of 96 beats per minute.  The patient has essential hypertension, blood pressure well controlled on benazepril/hydrochlorothiazide and amlodipine. The patient follows a low-sodium, no added salt diet.  The patient's hyperlipidemia, LDL cholesterol was 88 on 02/28/2015, currently on pravastatin, which is tolerated well without apparent side-effects. The patient follows a low-cholesterol diet.  Past Medical and Surgical History  Past Medical History Past Medical History:  Diagnosis Date  . Anemia, unspecified  . Benign hypertension  . Bradycardia  . CAD (coronary artery disease)  Insignificant CAD by cardiac catheterization  . Fibrocystic breast disease  . GI bleed, unspecified  due to hemorrhoids  . History of degenerative disc disease  . Hyperlipidemia, unspecified, unspecified  . PVD (peripheral vascular disease) (CMS-HCC)  w/ carotid bruits  . Venous stasis  w/ peripheral edema   Past Surgical History She has a past surgical history that includes Hysterectomy and Left Hip Surgery.   Medications and Allergies  Current Medications  Current Outpatient Prescriptions  Medication Sig Dispense Refill  . amLODIPine (NORVASC) 5 MG tablet Take 1 tablet (5 mg total) by mouth once daily. 90 tablet 3  . benazepril-hydrochlorthiazide (LOTENSIN HCT) 20-12.5 mg tablet Take 1 tablet by mouth once daily. 90 tablet 3  . calcium 500 mg Tab Take by mouth once daily.  . chlorhexidine (PERIDEX) 0.12 % solution  . naproxen sodium (ALEVE, ANAPROX) 220 MG tablet Take 220 mg by mouth 2 (two) times daily with meals.  . pantoprazole (PROTONIX)  40 MG DR tablet Take 1 tablet (40 mg total) by mouth once daily. 90 tablet 3  . pravastatin (PRAVACHOL) 40 MG tablet Take 1 tablet (40 mg total) by mouth once daily. 90 tablet 1  . selenium sulfide 2.5 % lotion Apply topically once daily. 118 mL 3  . clopidogrel (PLAVIX) 75 mg tablet TAKE ONE TABLET BY MOUTH EVERY DAY (Patient not taking: Reported on 02/08/2017) 90  tablet 1   No current facility-administered medications for this visit.   Allergies: Patient has no known allergies.  Social and Family History  Social History reports that she has never smoked. She has never used smokeless tobacco. She reports that she does not drink alcohol or use drugs.  Family History Family History  Problem Relation Age of Onset  . Heart disease Father  . Coronary Artery Disease (Blocked arteries around heart) Brother  Pacemeker, CABG  . Parkinsonism Brother  . Emphysema Brother  . Heart disease Brother  . Other Brother  RENAL FAILURE  . Parkinsonism Sister  . No Known Problems Sister  . No Known Problems Sister  . No Known Problems Sister   Review of Systems   Review of Systems: The patient denies chest pain, shortness of breath, orthopnea, paroxysmal nocturnal dyspnea, pedal edema, palpitations, heart racing, presyncope, syncope. Review of 12 Systems is negative except as described above.  Physical Examination   Vitals: BP 120/60  Pulse (!) 46  Ht 154.9 cm (5\' 1" )  Wt 53.1 kg (117 lb)  BMI 22.11 kg/m  Ht:154.9 cm (5\' 1" ) Wt:53.1 kg (117 lb) JAS:NKNL surface area is 1.51 meters squared. Body mass index is 22.11 kg/m.  HEENT: Pupils equally reactive to light and accomodation  Neck: Supple without thyromegaly, carotid pulses 2+ Lungs: clear to auscultation bilaterally; no wheezes, rales, rhonchi Heart: Regular rate and rhythm. No gallops, murmurs or rub Abdomen: soft nontender, nondistended, with normal bowel sounds Extremities: no cyanosis, clubbing, or edema Peripheral Pulses: 2+ in  all extremities, 2+ femoral pulses bilaterally  Assessment   83 y.o. female with  1. Hemispheric carotid artery syndrome  2. PVD (peripheral vascular disease) (CMS-HCC)  3. Coronary artery disease involving native coronary artery of native heart without angina pectoris  4. Benign hypertension  5. Pure hypercholesterolemia  6. Venous stasis  7. Bradycardia  8. Stokes-Adams syncope   83 year old female with sinus bradycardia, probable sick sinus syndrome, reluctant to proceed with pacemaker in the past, admitted recently after syncopal episode. Patient has been off of Plavix for 5 days. Patient has essential hypertension, systolic blood pressure mildly elevated on current BP medications. The patient has hyperlipidemia with good control of LDL cholesterol on pravastatin.  Plan   1. Continue current medications 2. Defer permanent pacemaker implantation at this time 3. Counseled patient about low-sodium diet 4. DASH diet printed instructions given to the patient 5. Counseled patient about low-cholesterol diet 6. Continue pravastatin for hyperlipidemia management  7. Low-fat and cholesterol diet printed instructions given to the patient 8. Proceed with permanent pacemaker implantation. We discussed the risks, benefits alternatives of permanent pacemaker implantation, and signed informed consent was obtained.  No orders of the defined types were placed in this encounter.  Return in about 1 week (around 02/15/2017), or after pacemaker.  Isaias Cowman, MD    Back to top of Progress Notes Richardo Priest, RN - 02/08/2017 10:45 AM EDT With syncope spell  Back to top of Progress Notes   Plan of Treatment - as of this encounter   Upcoming Encounters Upcoming Encounters  Date Type Specialty Care Team Description  03/25/2017 Office Visit Internal Medicine Idelle Crouch, MD  [place]  [facility] [place], [place] [address]  [phone]    [phone] (Fax)     Visit Diagnoses    Diagnosis  Hemispheric carotid artery syndrome - Primary  PVD (peripheral vascular disease) (CMS-HCC)  Unspecified peripheral vascular disease   Coronary  artery disease involving native coronary artery of native heart without angina pectoris  Benign hypertension  Essential hypertension, benign   Pure hypercholesterolemia  Venous stasis  Unspecified venous (peripheral) insufficiency   Bradycardia  Other specified cardiac dysrhythmias   Stokes-Adams syncope  Unspecified conduction disorder    Images Document Information   Primary Care Provider Idelle Crouch MD (Apr. 30, 2015 - Present) [phone] (Work) [phone] (Fax) [facility] [place], [place] [address]  Document Coverage Dates Oct. 01, 2018  [facility] [address] [place], [place] [address]   Encounter Providers Isaias Cowman MD (Attending) [phone] (Work) [phone] (Fax) [facility] [place], [place] [address]   Encounter Date Oct. 01, 2018   Show All Sections

## 2017-02-09 NOTE — Transfer of Care (Signed)
Immediate Anesthesia Transfer of Care Note  Patient: Teresa Wood  Procedure(s) Performed: INSERTION PACEMAKER-DUAL CHAMBER (Left )  Patient Location: PACU  Anesthesia Type:General  Level of Consciousness: awake, alert  and oriented  Airway & Oxygen Therapy: Patient Spontanous Breathing  Post-op Assessment: Report given to RN and Post -op Vital signs reviewed and stable  Post vital signs: Reviewed and stable  Last Vitals:  Vitals:   02/09/17 1142 02/09/17 1529  BP: (!) 160/42 (!) 144/54  Pulse: (!) 47 78  Resp: 18 18  Temp: 36.4 C   SpO2: 100% 96%    Last Pain:  Vitals:   02/09/17 1142  TempSrc: Oral         Complications: No apparent anesthesia complications

## 2017-02-09 NOTE — Anesthesia Preprocedure Evaluation (Signed)
Anesthesia Evaluation  Patient identified by MRN, date of birth, ID band Patient awake    Reviewed: Allergy & Precautions, H&P , NPO status , Patient's Chart, lab work & pertinent test results  History of Anesthesia Complications Negative for: history of anesthetic complications  Airway Mallampati: III  TM Distance: >3 FB Neck ROM: full    Dental  (+) Poor Dentition, Chipped, Missing, Partial Upper   Pulmonary neg pulmonary ROS, neg shortness of breath,           Cardiovascular Exercise Tolerance: Poor hypertension, (-) angina(-) Past MI      Neuro/Psych TIAnegative psych ROS   GI/Hepatic Neg liver ROS, GERD  Medicated and Controlled,  Endo/Other  negative endocrine ROS  Renal/GU negative Renal ROS  negative genitourinary   Musculoskeletal   Abdominal   Peds  Hematology negative hematology ROS (+)   Anesthesia Other Findings Past Medical History: No date: Elevated cholesterol No date: GERD (gastroesophageal reflux disease) No date: Hypertension No date: TIA (transient ischemic attack)  Past Surgical History: No date: ABDOMINAL HYSTERECTOMY     Reproductive/Obstetrics negative OB ROS                             Anesthesia Physical Anesthesia Plan  ASA: III  Anesthesia Plan: General   Post-op Pain Management:    Induction: Intravenous  PONV Risk Score and Plan: Propofol infusion  Airway Management Planned: Natural Airway and Nasal Cannula  Additional Equipment:   Intra-op Plan:   Post-operative Plan:   Informed Consent: I have reviewed the patients History and Physical, chart, labs and discussed the procedure including the risks, benefits and alternatives for the proposed anesthesia with the patient or authorized representative who has indicated his/her understanding and acceptance.   Dental Advisory Given  Plan Discussed with: Anesthesiologist, CRNA and  Surgeon  Anesthesia Plan Comments: (Patient consented for risks of anesthesia including but not limited to:  - adverse reactions to medications - risk of intubation if required - damage to teeth, lips or other oral mucosa - sore throat or hoarseness - Damage to heart, brain, lungs or loss of life  Patient voiced understanding.)        Anesthesia Quick Evaluation

## 2017-02-09 NOTE — Interval H&P Note (Signed)
History and Physical Interval Note:  02/09/2017 11:58 AM  Teresa Wood  has presented today for surgery, with the diagnosis of sss/bradycardia  The various methods of treatment have been discussed with the patient and family. After consideration of risks, benefits and other options for treatment, the patient has consented to  Procedure(s): INSERTION PACEMAKER-DUAL CHAMBER (N/A) as a surgical intervention .  The patient's history has been reviewed, patient examined, no change in status, stable for surgery.  I have reviewed the patient's chart and labs.  Questions were answered to the patient's satisfaction.     Shandiin Eisenbeis [employer]

## 2017-02-09 NOTE — OR Nursing (Signed)
Patient is in a paced rhythm vss

## 2017-02-09 NOTE — Progress Notes (Signed)
Patient is post PPM implant on the left upper chest. Arm supportive sling in place on the left arm. PPM site in clean dry and intact without any complication.  VSS, patient denied pain, and Apacing in the 60s. Will continue to monitor. Patient care transferred to [facility] @0230 

## 2017-02-10 ENCOUNTER — Encounter: Payer: Self-pay | Admitting: Cardiology

## 2017-02-10 DIAGNOSIS — Z9861 Coronary angioplasty status: Secondary | ICD-10-CM | POA: Diagnosis not present

## 2017-02-10 DIAGNOSIS — I495 Sick sinus syndrome: Secondary | ICD-10-CM | POA: Diagnosis not present

## 2017-02-10 LAB — GLUCOSE, CAPILLARY: GLUCOSE-CAPILLARY: 97 mg/dL (ref 65–99)

## 2017-02-10 MED ORDER — CEPHALEXIN 250 MG PO CAPS: 250.0000 mg | ORAL_CAPSULE | Freq: Four times a day (QID) | ORAL | 0 refills | Status: AC

## 2017-02-10 NOTE — Progress Notes (Signed)
Pt discharging home. IVs & heart monitor removed. Discharge instructions and education provided an reviewed. Pt walked out to daughter's car.

## 2017-02-10 NOTE — Discharge Summary (Signed)
   Physician Discharge Summary  Patient ID: Teresa Wood MRN: [id number] DOB/AGE: 81-Nov-1925 81 y.o.  Admit date: 02/09/2017 Discharge date: 02/10/2017  Primary Discharge Diagnosis Sick sinus syndrome/bradycardia Secondary Discharge Diagnosis same  Significant Diagnostic Studies: radiology: CXR: Negative for pneumothorax  Consults: cardiology  Hospital Course: 83 year old female with a diagnosis of sick sinus syndrome/bradycardia who failed conservative measures and elected for surgical management. The patient underwent successful dual-chamber rate-responsive pacemaker implantation on 02/09/2017 without perioperative complications. The patient reports feeling well this morning without complaints.   Discharge Exam: Blood pressure (!) 158/47, pulse 60, temperature 98.4 F (36.9 C), temperature source Oral, resp. rate 16, SpO2 94 %.   General appearance: alert, cooperative and no distress Head: Normocephalic, without obvious abnormality, atraumatic Eyes: EOM intact, pupils equally reactive to light Back: No obvious deformities Resp: clear to auscultation bilaterally Cardio: regular rate and rhythm, S1, S2 normal, no murmur, click, rub or gallop Extremities: No edema Skin: Skin color, texture, turgor normal. No rashes or lesions Neurologic: Grossly normal Incision/Wound: No active drainage or bleeding. No significant erythema or warmth around the incision site. Steri strips in place.  Labs:   Lab Results  Component Value Date   WBC 7.5 02/05/2017   HGB 10.9 (L) 02/05/2017   HCT 31.3 (L) 02/05/2017   MCV 83.0 02/05/2017   PLT 189 02/05/2017    Recent Labs Lab 02/05/17 0410  NA 139  K 3.5  CL 107  CO2 26  BUN 20  CREATININE 1.16*  CALCIUM 8.8*  GLUCOSE 114*      Radiology: Chest xray negative for pneumothorax EKG: Atrial paced rhythm, rate 60 bpm  FOLLOW UP PLANS AND APPOINTMENTS  Allergies as of 02/10/2017   No Known Allergies     Medication List    TAKE  these medications   amLODipine 5 MG tablet Commonly known as:  NORVASC Take 5 mg by mouth daily.   benazepril-hydrochlorthiazide 20-12.5 MG tablet Commonly known as:  LOTENSIN HCT Take 1 tablet by mouth daily.   cephALEXin 250 MG capsule Commonly known as:  KEFLEX Take 1 capsule (250 mg total) by mouth 4 (four) times daily.   clopidogrel 75 MG tablet Commonly known as:  PLAVIX Take 75 mg by mouth daily.   pantoprazole 40 MG tablet Commonly known as:  PROTONIX Take 40 mg by mouth daily.   pravastatin 40 MG tablet Commonly known as:  PRAVACHOL Take 40 mg by mouth daily.      Follow-up Information    Paraschos, Alexander, MD Follow up in 1 week(s).   Specialty:  Cardiology Contact information: [facility] [place] [address] [phone]           BRING ALL MEDICATIONS WITH YOU TO FOLLOW UP APPOINTMENTS  Time spent with patient to include physician time: 25 minutes Signed:  Clabe Seal PA-C 02/10/2017, 8:15 AM

## 2017-02-10 NOTE — Discharge Instructions (Signed)
For 6 weeks, avoid lifting greater than 15 pounds or raising your arms above your head. You may shower in 24 hours, but avoid direct contact with the shower head to incision site. Should the strips get wet, just pat them dry. Leave steri-strips alone. We will remove them at the follow-up office visit. Plug in the [employer] pacemaker monitor and place it within 10 feet of where you sleep.

## 2017-02-10 NOTE — Anesthesia Postprocedure Evaluation (Signed)
Anesthesia Post Note  Patient: Teresa Wood  Procedure(s) Performed: INSERTION PACEMAKER-DUAL CHAMBER (Left )  Patient location during evaluation: PACU Anesthesia Type: General Level of consciousness: awake and alert Pain management: pain level controlled Vital Signs Assessment: post-procedure vital signs reviewed and stable Respiratory status: spontaneous breathing, nonlabored ventilation, respiratory function stable and patient connected to nasal cannula oxygen Cardiovascular status: blood pressure returned to baseline and stable Postop Assessment: no apparent nausea or vomiting Anesthetic complications: no     Last Vitals:  Vitals:   02/10/17 0357 02/10/17 0812  BP: (!) 139/47 (!) 158/47  Pulse: 60 60  Resp: 18 16  Temp: 36.9 C 36.9 C  SpO2: 96% 94%    Last Pain:  Vitals:   02/10/17 0812  TempSrc: Oral  PainSc:                  Precious Haws Pavel Gadd

## 2017-02-16 DIAGNOSIS — I10 Essential (primary) hypertension: Secondary | ICD-10-CM | POA: Diagnosis not present

## 2017-02-16 DIAGNOSIS — L89151 Pressure ulcer of sacral region, stage 1: Secondary | ICD-10-CM | POA: Diagnosis not present

## 2017-02-16 DIAGNOSIS — R55 Syncope and collapse: Secondary | ICD-10-CM | POA: Diagnosis not present

## 2017-02-16 DIAGNOSIS — M545 Low back pain: Secondary | ICD-10-CM | POA: Diagnosis not present

## 2017-02-16 DIAGNOSIS — E7800 Pure hypercholesterolemia, unspecified: Secondary | ICD-10-CM | POA: Diagnosis not present

## 2017-02-16 DIAGNOSIS — R001 Bradycardia, unspecified: Secondary | ICD-10-CM | POA: Diagnosis not present

## 2017-02-16 DIAGNOSIS — I2510 Atherosclerotic heart disease of native coronary artery without angina pectoris: Secondary | ICD-10-CM | POA: Diagnosis not present

## 2017-02-16 DIAGNOSIS — I739 Peripheral vascular disease, unspecified: Secondary | ICD-10-CM | POA: Diagnosis not present

## 2017-02-16 DIAGNOSIS — G459 Transient cerebral ischemic attack, unspecified: Secondary | ICD-10-CM | POA: Diagnosis not present

## 2017-02-16 DIAGNOSIS — I459 Conduction disorder, unspecified: Secondary | ICD-10-CM | POA: Diagnosis not present

## 2017-02-16 DIAGNOSIS — R531 Weakness: Secondary | ICD-10-CM | POA: Diagnosis not present

## 2017-02-19 DIAGNOSIS — M1990 Unspecified osteoarthritis, unspecified site: Secondary | ICD-10-CM | POA: Diagnosis not present

## 2017-02-19 DIAGNOSIS — Z950 Presence of cardiac pacemaker: Secondary | ICD-10-CM | POA: Diagnosis not present

## 2017-02-19 DIAGNOSIS — Z8673 Personal history of transient ischemic attack (TIA), and cerebral infarction without residual deficits: Secondary | ICD-10-CM | POA: Diagnosis not present

## 2017-02-19 DIAGNOSIS — M545 Low back pain: Secondary | ICD-10-CM | POA: Diagnosis not present

## 2017-02-19 DIAGNOSIS — Z7902 Long term (current) use of antithrombotics/antiplatelets: Secondary | ICD-10-CM | POA: Diagnosis not present

## 2017-02-19 DIAGNOSIS — Z48812 Encounter for surgical aftercare following surgery on the circulatory system: Secondary | ICD-10-CM | POA: Diagnosis not present

## 2017-02-19 DIAGNOSIS — I739 Peripheral vascular disease, unspecified: Secondary | ICD-10-CM | POA: Diagnosis not present

## 2017-02-19 DIAGNOSIS — I10 Essential (primary) hypertension: Secondary | ICD-10-CM | POA: Diagnosis not present

## 2017-02-19 DIAGNOSIS — I2510 Atherosclerotic heart disease of native coronary artery without angina pectoris: Secondary | ICD-10-CM | POA: Diagnosis not present

## 2017-02-26 DIAGNOSIS — Z8739 Personal history of other diseases of the musculoskeletal system and connective tissue: Secondary | ICD-10-CM | POA: Diagnosis not present

## 2017-02-26 DIAGNOSIS — I10 Essential (primary) hypertension: Secondary | ICD-10-CM | POA: Diagnosis not present

## 2017-02-26 DIAGNOSIS — M2550 Pain in unspecified joint: Secondary | ICD-10-CM | POA: Diagnosis not present

## 2017-02-26 DIAGNOSIS — Z79899 Other long term (current) drug therapy: Secondary | ICD-10-CM | POA: Diagnosis not present

## 2017-03-05 ENCOUNTER — Emergency Department: Payer: PPO

## 2017-03-05 ENCOUNTER — Emergency Department
Admission: EM | Admit: 2017-03-05 | Discharge: 2017-03-05 | Disposition: A | Discharge status: Home / Self Care | Payer: PPO | Attending: Emergency Medicine | Admitting: Emergency Medicine

## 2017-03-05 DIAGNOSIS — R2243 Localized swelling, mass and lump, lower limb, bilateral: Secondary | ICD-10-CM | POA: Insufficient documentation

## 2017-03-05 DIAGNOSIS — R55 Syncope and collapse: Secondary | ICD-10-CM | POA: Diagnosis not present

## 2017-03-05 DIAGNOSIS — Z8673 Personal history of transient ischemic attack (TIA), and cerebral infarction without residual deficits: Secondary | ICD-10-CM | POA: Insufficient documentation

## 2017-03-05 DIAGNOSIS — M7989 Other specified soft tissue disorders: Secondary | ICD-10-CM | POA: Diagnosis not present

## 2017-03-05 DIAGNOSIS — Z79899 Other long term (current) drug therapy: Secondary | ICD-10-CM | POA: Diagnosis not present

## 2017-03-05 DIAGNOSIS — Z7901 Long term (current) use of anticoagulants: Secondary | ICD-10-CM | POA: Insufficient documentation

## 2017-03-05 DIAGNOSIS — R0689 Other abnormalities of breathing: Secondary | ICD-10-CM | POA: Diagnosis not present

## 2017-03-05 DIAGNOSIS — I10 Essential (primary) hypertension: Secondary | ICD-10-CM | POA: Diagnosis not present

## 2017-03-05 DIAGNOSIS — Z950 Presence of cardiac pacemaker: Secondary | ICD-10-CM | POA: Insufficient documentation

## 2017-03-05 DIAGNOSIS — I700 Atherosclerosis of aorta: Secondary | ICD-10-CM | POA: Diagnosis not present

## 2017-03-05 LAB — URINALYSIS, COMPLETE (UACMP) WITH MICROSCOPIC
BACTERIA UA: NONE SEEN
Bilirubin Urine: NEGATIVE
GLUCOSE, UA: NEGATIVE mg/dL
HGB URINE DIPSTICK: NEGATIVE
KETONES UR: 5 mg/dL — AB
LEUKOCYTES UA: NEGATIVE
NITRITE: NEGATIVE
PH: 7 (ref 5.0–8.0)
Protein, ur: NEGATIVE mg/dL
RBC / HPF: NONE SEEN RBC/hpf (ref 0–5)
Specific Gravity, Urine: 1.023 (ref 1.005–1.030)
Squamous Epithelial / LPF: NONE SEEN

## 2017-03-05 LAB — BASIC METABOLIC PANEL
ANION GAP: 13 (ref 5–15)
BUN: 24 mg/dL — AB (ref 6–20)
CALCIUM: 8.9 mg/dL (ref 8.9–10.3)
CO2: 24 mmol/L (ref 22–32)
CREATININE: 1.12 mg/dL — AB (ref 0.44–1.00)
Chloride: 97 mmol/L — ABNORMAL LOW (ref 101–111)
GFR calc Af Amer: 48 mL/min — ABNORMAL LOW (ref >60)
GFR, EST NON AFRICAN AMERICAN: 42 mL/min — AB (ref >60)
GLUCOSE: 106 mg/dL — AB (ref 65–99)
Potassium: 3.7 mmol/L (ref 3.5–5.1)
Sodium: 134 mmol/L — ABNORMAL LOW (ref 135–145)

## 2017-03-05 LAB — CBC
HEMATOCRIT: 36.3 % (ref 35.0–47.0)
Hemoglobin: 12.1 g/dL (ref 12.0–16.0)
MCH: 28.1 pg (ref 26.0–34.0)
MCHC: 33.4 g/dL (ref 32.0–36.0)
MCV: 84.3 fL (ref 80.0–100.0)
PLATELETS: 312 10*3/uL (ref 150–440)
RBC: 4.3 MIL/uL (ref 3.80–5.20)
RDW: 15.7 % — AB (ref 11.5–14.5)
WBC: 10.4 10*3/uL (ref 3.6–11.0)

## 2017-03-05 LAB — TROPONIN I: Troponin I: <0.03 ng/mL (ref <0.03)

## 2017-03-05 MED ORDER — IOPAMIDOL (ISOVUE-370) INJECTION 76%
60.0000 mL | Freq: Once | INTRAVENOUS | Status: AC | PRN
  Administered 2017-03-05: 60 mL via INTRAVENOUS

## 2017-03-05 NOTE — Discharge Instructions (Signed)
Please follow-up with your regular doctor return for any further problems. your pacemaker checked out okay and your lab work is normal.

## 2017-03-05 NOTE — ED Provider Notes (Signed)
imaging shows no acute disease and a pacemaker reported as normal. Second troponin is negative. There is no explanation for the patient's symptoms I can see. Patient daughter wants to go home aware of the fact that this could happen again and are willing to accept the risk. I will discharge him   Teresa Polio, MD 03/05/17 8453

## 2017-03-05 NOTE — ED Provider Notes (Signed)
[facility] Emergency Department Provider Note  ____________________________________________  Time seen: Approximately 2:22 PM  I have reviewed the triage vital signs and the nursing notes.   HISTORY  Chief Complaint Near Syncope   HPI Teresa Wood is a 83 y.o. female with a history of sick sinus syndrome status post pacemaker placement 3 weeks ago, hypertension, hyperlipidemia, CAD, peripheral vascular disease who presents for evaluation of a syncopal episode. According to patient's daughter patient had just finished her shower and was walking into the den and started complaining that the house was too hot. The daughter asked her to sit down and patient started asking for help. She sat down on the chair and her daughter noticed that her head was slumped, her eyes were open but she was not answering to questions. This episode lasted a few minutes. She looked pale and diaphoretic. She had slightly increased work of breathing. When she regained consciousness patient was back to her baseline and not confused. Patient remembers walking into the den but does not remember passing out or feeling dizzy.  NO fall or trauma. Patient reports that she ate normally today. She denies headache, changes in vision, neck pain, chest pain, shortness of breath, abdominal pain, nausea, vomiting, diarrhea, dysuria, palpitations, or fever. Patient is back to baseline at this time. For the last week to 10 days patient has noted swelling of both of her lower extremities worse on the right itches new for her. She denies any personal or family history of blood clots.  Past Medical History:  Diagnosis Date  . Elevated cholesterol   . GERD (gastroesophageal reflux disease)   . Hypertension   . TIA (transient ischemic attack)     Patient Active Problem List   Diagnosis Date Noted  . Sick sinus syndrome ([place]) 02/09/2017  . Syncope 02/04/2017    Past Surgical History:  Procedure  Laterality Date  . ABDOMINAL HYSTERECTOMY    . HIP SURGERY Left   . PACEMAKER INSERTION Left 02/09/2017   Procedure: INSERTION PACEMAKER-DUAL CHAMBER;  Surgeon: Isaias Cowman, MD;  Location: [facility] ORS;  Service: Cardiovascular;  Laterality: Left;    Prior to Admission medications   Medication Sig Start Date End Date Taking? Authorizing Provider  amLODipine (NORVASC) 5 MG tablet Take 5 mg by mouth daily.   Yes [provider]  benazepril-hydrochlorthiazide (LOTENSIN HCT) 20-12.5 MG tablet Take 1 tablet by mouth daily.   Yes [provider]  CALCIUM PO Take 1 tablet by mouth daily.    Yes [provider]  clopidogrel (PLAVIX) 75 MG tablet Take 75 mg by mouth daily.   Yes [provider]  gabapentin (NEURONTIN) 100 MG capsule Take 100 mg by mouth 3 (three) times daily.  02/26/17 02/26/18 Yes [provider]  pantoprazole (PROTONIX) 40 MG tablet Take 40 mg by mouth daily.   Yes [provider]  pravastatin (PRAVACHOL) 40 MG tablet Take 40 mg by mouth daily.   Yes [provider]    Allergies Patient has no known allergies.  No family history on file.  Social History Social History  Substance Use Topics  . Smoking status: Never Smoker  . Smokeless tobacco: Never Used  . Alcohol use No    Review of Systems  Constitutional: Negative for fever. + syncope Eyes: Negative for visual changes. ENT: Negative for sore throat. Neck: No neck pain  Cardiovascular: Negative for chest pain. Respiratory: Negative for shortness of breath. Gastrointestinal: Negative for abdominal pain, vomiting or  diarrhea. Genitourinary: Negative for dysuria. Musculoskeletal: Negative for back pain. + b/l leg swelling Skin: Negative for rash. Neurological: Negative for headaches, weakness or numbness. Psych: No SI or HI  ____________________________________________   PHYSICAL EXAM:  VITAL SIGNS: ED Triage Vitals  Enc Vitals Group      BP 03/05/17 1347 (!) 135/57     Pulse Rate 03/05/17 1347 62     Resp 03/05/17 1347 16     Temp 03/05/17 1347 97.7 F (36.5 C)     Temp Source 03/05/17 1347 Oral     SpO2 03/05/17 1337 96 %     Weight 03/05/17 1338 118 lb (53.5 kg)     Height 03/05/17 1338 5\' 6"  (1.676 m)     Head Circumference --      Peak Flow --      Pain Score --      Pain Loc --      Pain Edu? --      Excl. in [place]? --     Constitutional: Alert and oriented. Well appearing and in no apparent distress. HEENT:      Head: Normocephalic and atraumatic.         Eyes: Conjunctivae are normal. Sclera is non-icteric.       Mouth/Throat: Mucous membranes are moist.       Neck: Supple with no signs of meningismus. Cardiovascular: Regular rate and rhythm. No murmurs, gallops, or rubs. 2+ symmetrical distal pulses are present in all extremities. No JVD. Respiratory: Normal respiratory effort. Lungs are clear to auscultation bilaterally. No wheezes, crackles, or rhonchi.  Gastrointestinal: Soft, non tender, and non distended with positive bowel sounds. No rebound or guarding. Musculoskeletal: Asymmetric leg edema R>L Neurologic: Normal speech and language. A & O x3, PERRL, no nystagmus, CN II-XII intact, motor testing reveals good tone and bulk throughout. There is no evidence of pronator drift or dysmetria. Muscle strength is 5/5 throughout. Sensory examination is intact. Gait deferred Skin: Skin is warm, dry and intact. No rash noted. Psychiatric: Mood and affect are normal. Speech and behavior are normal.  ____________________________________________   LABS (all labs ordered are listed, but only abnormal results are displayed)  Labs Reviewed  CBC - Abnormal; Notable for the following:       Result Value   RDW 15.7 (*)    All other components within normal limits  URINALYSIS, COMPLETE (UACMP) WITH MICROSCOPIC - Abnormal; Notable for the following:    Color, Urine STRAW (*)    APPearance CLEAR (*)    Ketones, ur 5  (*)    All other components within normal limits  BASIC METABOLIC PANEL - Abnormal; Notable for the following:    Sodium 134 (*)    Chloride 97 (*)    Glucose, Bld 106 (*)    BUN 24 (*)    Creatinine, Ser 1.12 (*)    GFR calc non Af Amer 42 (*)    GFR calc Af Amer 48 (*)    All other components within normal limits  TROPONIN I  TROPONIN I   ____________________________________________  EKG  ED ECG REPORT I, Rudene Re, the attending physician, personally viewed and interpreted this ECG.  Atrial paced rhythm, rate of 62, normal QTC, normal axis, no ST elevations or depressions. ____________________________________________  RADIOLOGY  Doppler US: PND   CTA chest: PND  XR ankle: PND ____________________________________________   PROCEDURES  Procedure(s) performed: None Procedures Critical Care performed:  None ____________________________________________   INITIAL IMPRESSION / ASSESSMENT AND PLAN /  ED COURSE  83 y.o. female with a history of sick sinus syndrome status post pacemaker placement 3 weeks ago, hypertension, hyperlipidemia, CAD, peripheral vascular disease who presents for evaluation of a syncopal episode. Patient has asymmetric leg swelling with a recent hospitalization increasing her risk for DVT/PE. We'll send patient for Doppler studies and a CTA of her chest. We'll interrogate the pacemaker for any dysrhythmia or cardiac syncope. Orthostatic vital signs are negative. Patient is on Plavix therefore will check blood work to rule out acute blood loss anemia even though she denies any bleeding or melena. We'll check for dehydration, UTI, or electrolyte abnormalities. We'll monitor patient on telemetry.  _________________________ 3:17 PM on 03/05/2017 -----------------------------------------  Labs, pacemaker interrogation, urine, and imaging studies pending. Care transferred to Dr. Cinda Quest.   As part of my medical decision making, I reviewed the  following data within the [place] History obtained from family, Nursing notes reviewed and incorporated, Labs reviewed , EKG interpreted , Old EKG reviewed, Old chart reviewed, Patient signed out to Dr. Cinda Quest, Notes from prior ED visits and [place] Controlled Substance Database    Pertinent labs & imaging results that were available during my care of the patient were reviewed by me and considered in my medical decision making (see chart for details).    ____________________________________________   FINAL CLINICAL IMPRESSION(S) / ED DIAGNOSES  Final diagnoses:  Syncope and collapse      NEW MEDICATIONS STARTED DURING THIS VISIT:  Discharge Medication List as of 03/05/2017  6:19 PM       Note:  This document was prepared using Dragon voice recognition software and may include unintentional dictation errors.    Alfred Levins, [place], MD 03/09/17 0930

## 2017-03-05 NOTE — ED Triage Notes (Signed)
Pt to ED via EMS from home after reporting a near syncopal episode in the shower. Pt reported having become diaphoretic and lightheaded. Daughter reports pt had decreased LOC and EMS verbalized a BP on scene of 84/44. Pt had an atrial pacemaker placed 02/09/17 after a syncopal episode.   Pt is alert and oriented at this time with vitals WNL. PT report she feels weak but denies dizziness of lightheadedness. PT denies fall or pain at this time. PT reports having eaten and drank normally today.

## 2017-03-05 NOTE — ED Notes (Signed)
MD at bedside with patient

## 2017-03-05 NOTE — ED Notes (Signed)
Pacemaker interrogated complete

## 2017-03-13 DIAGNOSIS — Z7902 Long term (current) use of antithrombotics/antiplatelets: Secondary | ICD-10-CM | POA: Diagnosis not present

## 2017-03-13 DIAGNOSIS — Z8673 Personal history of transient ischemic attack (TIA), and cerebral infarction without residual deficits: Secondary | ICD-10-CM | POA: Diagnosis not present

## 2017-03-13 DIAGNOSIS — Z48812 Encounter for surgical aftercare following surgery on the circulatory system: Secondary | ICD-10-CM | POA: Diagnosis not present

## 2017-03-13 DIAGNOSIS — M1990 Unspecified osteoarthritis, unspecified site: Secondary | ICD-10-CM | POA: Diagnosis not present

## 2017-03-13 DIAGNOSIS — M545 Low back pain: Secondary | ICD-10-CM | POA: Diagnosis not present

## 2017-03-13 DIAGNOSIS — I2510 Atherosclerotic heart disease of native coronary artery without angina pectoris: Secondary | ICD-10-CM | POA: Diagnosis not present

## 2017-03-13 DIAGNOSIS — Z950 Presence of cardiac pacemaker: Secondary | ICD-10-CM | POA: Diagnosis not present

## 2017-03-13 DIAGNOSIS — I739 Peripheral vascular disease, unspecified: Secondary | ICD-10-CM | POA: Diagnosis not present

## 2017-03-13 DIAGNOSIS — I10 Essential (primary) hypertension: Secondary | ICD-10-CM | POA: Diagnosis not present

## 2017-03-15 DIAGNOSIS — M545 Low back pain: Secondary | ICD-10-CM | POA: Diagnosis not present

## 2017-03-15 DIAGNOSIS — I10 Essential (primary) hypertension: Secondary | ICD-10-CM | POA: Diagnosis not present

## 2017-03-15 DIAGNOSIS — I2510 Atherosclerotic heart disease of native coronary artery without angina pectoris: Secondary | ICD-10-CM | POA: Diagnosis not present

## 2017-03-15 DIAGNOSIS — Z48812 Encounter for surgical aftercare following surgery on the circulatory system: Secondary | ICD-10-CM | POA: Diagnosis not present

## 2017-03-25 DIAGNOSIS — M25562 Pain in left knee: Secondary | ICD-10-CM | POA: Diagnosis not present

## 2017-03-25 DIAGNOSIS — D519 Vitamin B12 deficiency anemia, unspecified: Secondary | ICD-10-CM | POA: Diagnosis not present

## 2017-03-25 DIAGNOSIS — Z950 Presence of cardiac pacemaker: Secondary | ICD-10-CM | POA: Diagnosis not present

## 2017-03-25 DIAGNOSIS — Z79899 Other long term (current) drug therapy: Secondary | ICD-10-CM | POA: Diagnosis not present

## 2017-03-25 DIAGNOSIS — I10 Essential (primary) hypertension: Secondary | ICD-10-CM | POA: Diagnosis not present

## 2017-05-06 DIAGNOSIS — D519 Vitamin B12 deficiency anemia, unspecified: Secondary | ICD-10-CM | POA: Diagnosis not present

## 2017-05-06 DIAGNOSIS — Z79899 Other long term (current) drug therapy: Secondary | ICD-10-CM | POA: Diagnosis not present

## 2017-05-06 DIAGNOSIS — R011 Cardiac murmur, unspecified: Secondary | ICD-10-CM | POA: Diagnosis not present

## 2017-05-06 DIAGNOSIS — R42 Dizziness and giddiness: Secondary | ICD-10-CM | POA: Diagnosis not present

## 2017-05-06 DIAGNOSIS — E7800 Pure hypercholesterolemia, unspecified: Secondary | ICD-10-CM | POA: Diagnosis not present

## 2017-05-06 DIAGNOSIS — I739 Peripheral vascular disease, unspecified: Secondary | ICD-10-CM | POA: Diagnosis not present

## 2017-05-06 DIAGNOSIS — I10 Essential (primary) hypertension: Secondary | ICD-10-CM | POA: Diagnosis not present

## 2017-05-06 DIAGNOSIS — Z950 Presence of cardiac pacemaker: Secondary | ICD-10-CM | POA: Diagnosis not present

## 2017-05-19 DIAGNOSIS — E785 Hyperlipidemia, unspecified: Secondary | ICD-10-CM | POA: Diagnosis not present

## 2017-05-19 DIAGNOSIS — R011 Cardiac murmur, unspecified: Secondary | ICD-10-CM | POA: Diagnosis not present

## 2017-05-19 DIAGNOSIS — R55 Syncope and collapse: Secondary | ICD-10-CM | POA: Diagnosis not present

## 2017-05-19 DIAGNOSIS — I10 Essential (primary) hypertension: Secondary | ICD-10-CM | POA: Diagnosis not present

## 2017-05-19 DIAGNOSIS — R42 Dizziness and giddiness: Secondary | ICD-10-CM | POA: Diagnosis not present

## 2017-05-19 DIAGNOSIS — I6523 Occlusion and stenosis of bilateral carotid arteries: Secondary | ICD-10-CM | POA: Diagnosis not present

## 2017-05-19 DIAGNOSIS — Z950 Presence of cardiac pacemaker: Secondary | ICD-10-CM | POA: Diagnosis not present

## 2017-05-19 DIAGNOSIS — G459 Transient cerebral ischemic attack, unspecified: Secondary | ICD-10-CM | POA: Diagnosis not present

## 2017-05-19 DIAGNOSIS — I2510 Atherosclerotic heart disease of native coronary artery without angina pectoris: Secondary | ICD-10-CM | POA: Diagnosis not present

## 2017-05-28 DIAGNOSIS — H35342 Macular cyst, hole, or pseudohole, left eye: Secondary | ICD-10-CM | POA: Diagnosis not present

## 2017-08-04 DIAGNOSIS — Z79899 Other long term (current) drug therapy: Secondary | ICD-10-CM | POA: Diagnosis not present

## 2017-08-04 DIAGNOSIS — Z0000 Encounter for general adult medical examination without abnormal findings: Secondary | ICD-10-CM | POA: Diagnosis not present

## 2017-08-04 DIAGNOSIS — D519 Vitamin B12 deficiency anemia, unspecified: Secondary | ICD-10-CM | POA: Diagnosis not present

## 2017-08-04 DIAGNOSIS — I2510 Atherosclerotic heart disease of native coronary artery without angina pectoris: Secondary | ICD-10-CM | POA: Diagnosis not present

## 2017-08-04 DIAGNOSIS — I10 Essential (primary) hypertension: Secondary | ICD-10-CM | POA: Diagnosis not present

## 2017-08-04 DIAGNOSIS — E785 Hyperlipidemia, unspecified: Secondary | ICD-10-CM | POA: Diagnosis not present

## 2017-09-21 DIAGNOSIS — R001 Bradycardia, unspecified: Secondary | ICD-10-CM | POA: Diagnosis not present

## 2017-09-29 DIAGNOSIS — I10 Essential (primary) hypertension: Secondary | ICD-10-CM | POA: Diagnosis not present

## 2017-09-29 DIAGNOSIS — E785 Hyperlipidemia, unspecified: Secondary | ICD-10-CM | POA: Diagnosis not present

## 2017-09-29 DIAGNOSIS — R001 Bradycardia, unspecified: Secondary | ICD-10-CM | POA: Diagnosis not present

## 2017-09-29 DIAGNOSIS — Z950 Presence of cardiac pacemaker: Secondary | ICD-10-CM | POA: Diagnosis not present

## 2017-09-29 DIAGNOSIS — I2510 Atherosclerotic heart disease of native coronary artery without angina pectoris: Secondary | ICD-10-CM | POA: Diagnosis not present

## 2017-09-29 DIAGNOSIS — R55 Syncope and collapse: Secondary | ICD-10-CM | POA: Diagnosis not present

## 2017-09-29 DIAGNOSIS — G459 Transient cerebral ischemic attack, unspecified: Secondary | ICD-10-CM | POA: Diagnosis not present

## 2017-09-29 DIAGNOSIS — I739 Peripheral vascular disease, unspecified: Secondary | ICD-10-CM | POA: Diagnosis not present

## 2017-12-06 DIAGNOSIS — Z79899 Other long term (current) drug therapy: Secondary | ICD-10-CM | POA: Diagnosis not present

## 2017-12-06 DIAGNOSIS — I2510 Atherosclerotic heart disease of native coronary artery without angina pectoris: Secondary | ICD-10-CM | POA: Diagnosis not present

## 2017-12-06 DIAGNOSIS — E785 Hyperlipidemia, unspecified: Secondary | ICD-10-CM | POA: Diagnosis not present

## 2017-12-06 DIAGNOSIS — I10 Essential (primary) hypertension: Secondary | ICD-10-CM | POA: Diagnosis not present

## 2017-12-06 DIAGNOSIS — N390 Urinary tract infection, site not specified: Secondary | ICD-10-CM | POA: Diagnosis not present

## 2017-12-21 DIAGNOSIS — R001 Bradycardia, unspecified: Secondary | ICD-10-CM | POA: Diagnosis not present

## 2017-12-29 DIAGNOSIS — L600 Ingrowing nail: Secondary | ICD-10-CM | POA: Diagnosis not present

## 2018-03-22 DIAGNOSIS — R001 Bradycardia, unspecified: Secondary | ICD-10-CM | POA: Diagnosis not present

## 2018-03-28 DIAGNOSIS — R55 Syncope and collapse: Secondary | ICD-10-CM | POA: Diagnosis not present

## 2018-03-28 DIAGNOSIS — I10 Essential (primary) hypertension: Secondary | ICD-10-CM | POA: Diagnosis not present

## 2018-03-28 DIAGNOSIS — R001 Bradycardia, unspecified: Secondary | ICD-10-CM | POA: Diagnosis not present

## 2018-03-28 DIAGNOSIS — G459 Transient cerebral ischemic attack, unspecified: Secondary | ICD-10-CM | POA: Diagnosis not present

## 2018-03-28 DIAGNOSIS — Z950 Presence of cardiac pacemaker: Secondary | ICD-10-CM | POA: Diagnosis not present

## 2018-03-28 DIAGNOSIS — E785 Hyperlipidemia, unspecified: Secondary | ICD-10-CM | POA: Diagnosis not present

## 2018-04-15 DIAGNOSIS — N6019 Diffuse cystic mastopathy of unspecified breast: Secondary | ICD-10-CM | POA: Insufficient documentation

## 2018-04-15 DIAGNOSIS — D649 Anemia, unspecified: Secondary | ICD-10-CM | POA: Insufficient documentation

## 2018-04-15 DIAGNOSIS — E785 Hyperlipidemia, unspecified: Secondary | ICD-10-CM | POA: Insufficient documentation

## 2018-04-15 DIAGNOSIS — I739 Peripheral vascular disease, unspecified: Secondary | ICD-10-CM | POA: Insufficient documentation

## 2018-04-15 DIAGNOSIS — I2510 Atherosclerotic heart disease of native coronary artery without angina pectoris: Secondary | ICD-10-CM | POA: Insufficient documentation

## 2018-04-15 DIAGNOSIS — Z8739 Personal history of other diseases of the musculoskeletal system and connective tissue: Secondary | ICD-10-CM | POA: Insufficient documentation

## 2018-04-15 DIAGNOSIS — I10 Essential (primary) hypertension: Secondary | ICD-10-CM | POA: Insufficient documentation

## 2018-04-15 DIAGNOSIS — K922 Gastrointestinal hemorrhage, unspecified: Secondary | ICD-10-CM | POA: Insufficient documentation

## 2018-04-15 DIAGNOSIS — R001 Bradycardia, unspecified: Secondary | ICD-10-CM | POA: Insufficient documentation

## 2018-04-15 DIAGNOSIS — I878 Other specified disorders of veins: Secondary | ICD-10-CM | POA: Insufficient documentation

## 2018-04-18 ENCOUNTER — Encounter: Payer: Self-pay | Admitting: Podiatry

## 2018-04-18 ENCOUNTER — Ambulatory Visit: Payer: PPO | Admitting: Podiatry

## 2018-04-18 DIAGNOSIS — L600 Ingrowing nail: Secondary | ICD-10-CM | POA: Diagnosis not present

## 2018-04-18 NOTE — Patient Instructions (Addendum)

## 2018-04-18 NOTE — Progress Notes (Signed)
  Subjective:  Patient ID: Teresa Wood, female    DOB: July 20, 1923,  MRN: [id number] HPI Chief Complaint  Patient presents with  . Toe Pain    Hallux left - lateral border, tender x 3-4 months, red and swollen  . New Patient (Initial Visit)    84 y.o. female presents with the above complaint.  ROS: Denies fever chills nausea vomiting muscle aches pains calf pain back pain chest pain shortness of breath.  Past Medical History:  Diagnosis Date  . Elevated cholesterol   . GERD (gastroesophageal reflux disease)   . Hypertension   . TIA (transient ischemic attack)    Past Surgical History:  Procedure Laterality Date  . ABDOMINAL HYSTERECTOMY    . HIP SURGERY Left   . PACEMAKER INSERTION Left 02/09/2017   Procedure: INSERTION PACEMAKER-DUAL CHAMBER;  Surgeon: Isaias Cowman, MD;  Location: [facility] ORS;  Service: Cardiovascular;  Laterality: Left;    Current Outpatient Medications:  .  selenium sulfide (SELSUN) 2.5 % shampoo, Apply topically., Disp: , Rfl:  .  amLODipine (NORVASC) 5 MG tablet, Take 5 mg by mouth daily., Disp: , Rfl:  .  benazepril-hydrochlorthiazide (LOTENSIN HCT) 20-12.5 MG tablet, Take 1 tablet by mouth daily., Disp: , Rfl:  .  CALCIUM PO, Take 1 tablet by mouth daily. , Disp: , Rfl:  .  clopidogrel (PLAVIX) 75 MG tablet, Take 75 mg by mouth daily., Disp: , Rfl:  .  gabapentin (NEURONTIN) 100 MG capsule, Take 100 mg by mouth 3 (three) times daily. , Disp: , Rfl:  .  naproxen sodium (ALEVE) 220 MG tablet, Take by mouth., Disp: , Rfl:  .  pantoprazole (PROTONIX) 40 MG tablet, Take 40 mg by mouth daily., Disp: , Rfl:  .  pravastatin (PRAVACHOL) 40 MG tablet, Take 40 mg by mouth daily., Disp: , Rfl:   No Known Allergies Review of Systems Objective:  There were no vitals filed for this visit.  General: Well developed, nourished, in no acute distress, alert and oriented x3   Dermatological: Skin is warm, dry and supple bilateral. Nails x 10 are well  maintained; remaining integument appears unremarkable at this time. There are no open sores, no preulcerative lesions, no rash or signs of infection present.  Vascular: Dorsalis Pedis artery and Posterior Tibial artery pedal pulses are 2/4 bilateral with immedate capillary fill time. Pedal hair growth present. No varicosities and no lower extremity edema present bilateral.   Neruologic: Grossly intact via light touch bilateral. Vibratory intact via tuning fork bilateral. Protective threshold with Semmes Wienstein monofilament intact to all pedal sites bilateral. Patellar and Achilles deep tendon reflexes 2+ bilateral. No Babinski or clonus noted bilateral.   Musculoskeletal: No gross boney pedal deformities bilateral. No pain, crepitus, or limitation noted with foot and ankle range of motion bilateral. Muscular strength 5/5 in all groups tested bilateral.  Gait: Unassisted, Nonantalgic.    Radiographs:  None taken  Assessment & Plan:   Assessment: Mild paronychia abscess ingrown nail lateral border hallux left  Plan: Partial temporary nail avulsion with incision and drainage.  This performed after local anesthetic she tolerated procedure well.  Was given both oral and home-going instruction for care and soaking of her toe follow-up with her in 2 weeks     Orlander Norwood T. [place], [place]

## 2018-05-02 ENCOUNTER — Ambulatory Visit (INDEPENDENT_AMBULATORY_CARE_PROVIDER_SITE_OTHER): Payer: PPO | Admitting: Podiatry

## 2018-05-02 DIAGNOSIS — L600 Ingrowing nail: Secondary | ICD-10-CM

## 2018-05-02 NOTE — Progress Notes (Signed)
She presents today for follow-up of her matrixectomy lateral border hallux left.  States that she has been soaking in Epsom salts but has recently quit doing so she denies fever chills nausea vomiting muscle aches and pains.  Objective: Vital signs are stable alert and oriented x3.  There is no erythema edema cellulitis drainage or odor.  Assessment: Well-healing surgical toe hallux left.  Plan: Discussed etiology pathology conservative surgical therapies.  Instructed her to continue to soak for the next few days then discontinue.  Follow-up with me as needed.

## 2018-05-18 DIAGNOSIS — L089 Local infection of the skin and subcutaneous tissue, unspecified: Secondary | ICD-10-CM | POA: Diagnosis not present

## 2018-07-19 DIAGNOSIS — R001 Bradycardia, unspecified: Secondary | ICD-10-CM | POA: Diagnosis not present

## 2018-09-28 DIAGNOSIS — Z950 Presence of cardiac pacemaker: Secondary | ICD-10-CM | POA: Diagnosis not present

## 2018-09-28 DIAGNOSIS — I2510 Atherosclerotic heart disease of native coronary artery without angina pectoris: Secondary | ICD-10-CM | POA: Diagnosis not present

## 2018-09-28 DIAGNOSIS — I10 Essential (primary) hypertension: Secondary | ICD-10-CM | POA: Diagnosis not present

## 2018-09-28 DIAGNOSIS — Z79899 Other long term (current) drug therapy: Secondary | ICD-10-CM | POA: Diagnosis not present

## 2018-09-28 DIAGNOSIS — E785 Hyperlipidemia, unspecified: Secondary | ICD-10-CM | POA: Diagnosis not present

## 2018-09-28 DIAGNOSIS — Z0000 Encounter for general adult medical examination without abnormal findings: Secondary | ICD-10-CM | POA: Diagnosis not present

## 2018-10-10 DIAGNOSIS — R55 Syncope and collapse: Secondary | ICD-10-CM | POA: Diagnosis not present

## 2018-10-10 DIAGNOSIS — I739 Peripheral vascular disease, unspecified: Secondary | ICD-10-CM | POA: Diagnosis not present

## 2018-10-10 DIAGNOSIS — Z950 Presence of cardiac pacemaker: Secondary | ICD-10-CM | POA: Diagnosis not present

## 2018-10-10 DIAGNOSIS — I2510 Atherosclerotic heart disease of native coronary artery without angina pectoris: Secondary | ICD-10-CM | POA: Diagnosis not present

## 2018-10-10 DIAGNOSIS — I10 Essential (primary) hypertension: Secondary | ICD-10-CM | POA: Diagnosis not present

## 2018-10-10 DIAGNOSIS — G459 Transient cerebral ischemic attack, unspecified: Secondary | ICD-10-CM | POA: Diagnosis not present

## 2018-10-10 DIAGNOSIS — R001 Bradycardia, unspecified: Secondary | ICD-10-CM | POA: Diagnosis not present

## 2018-10-10 DIAGNOSIS — I878 Other specified disorders of veins: Secondary | ICD-10-CM | POA: Diagnosis not present

## 2018-10-10 DIAGNOSIS — E785 Hyperlipidemia, unspecified: Secondary | ICD-10-CM | POA: Diagnosis not present

## 2018-11-14 DIAGNOSIS — H35342 Macular cyst, hole, or pseudohole, left eye: Secondary | ICD-10-CM | POA: Diagnosis not present

## 2018-11-15 DIAGNOSIS — I5022 Chronic systolic (congestive) heart failure: Secondary | ICD-10-CM | POA: Diagnosis not present

## 2019-03-28 DIAGNOSIS — Z950 Presence of cardiac pacemaker: Secondary | ICD-10-CM | POA: Diagnosis not present

## 2019-03-29 DIAGNOSIS — E785 Hyperlipidemia, unspecified: Secondary | ICD-10-CM | POA: Diagnosis not present

## 2019-03-29 DIAGNOSIS — I10 Essential (primary) hypertension: Secondary | ICD-10-CM | POA: Diagnosis not present

## 2019-03-29 DIAGNOSIS — I739 Peripheral vascular disease, unspecified: Secondary | ICD-10-CM | POA: Diagnosis not present

## 2019-03-29 DIAGNOSIS — I2510 Atherosclerotic heart disease of native coronary artery without angina pectoris: Secondary | ICD-10-CM | POA: Diagnosis not present

## 2019-03-29 DIAGNOSIS — Z0000 Encounter for general adult medical examination without abnormal findings: Secondary | ICD-10-CM | POA: Diagnosis not present

## 2019-03-29 DIAGNOSIS — Z79899 Other long term (current) drug therapy: Secondary | ICD-10-CM | POA: Diagnosis not present

## 2019-03-29 DIAGNOSIS — Z950 Presence of cardiac pacemaker: Secondary | ICD-10-CM | POA: Diagnosis not present

## 2019-04-12 DIAGNOSIS — R55 Syncope and collapse: Secondary | ICD-10-CM | POA: Diagnosis not present

## 2019-04-12 DIAGNOSIS — Z950 Presence of cardiac pacemaker: Secondary | ICD-10-CM | POA: Diagnosis not present

## 2019-04-12 DIAGNOSIS — E785 Hyperlipidemia, unspecified: Secondary | ICD-10-CM | POA: Diagnosis not present

## 2019-04-12 DIAGNOSIS — R001 Bradycardia, unspecified: Secondary | ICD-10-CM | POA: Diagnosis not present

## 2019-04-12 DIAGNOSIS — G459 Transient cerebral ischemic attack, unspecified: Secondary | ICD-10-CM | POA: Diagnosis not present

## 2019-04-12 DIAGNOSIS — I10 Essential (primary) hypertension: Secondary | ICD-10-CM | POA: Diagnosis not present

## 2019-04-12 DIAGNOSIS — I2510 Atherosclerotic heart disease of native coronary artery without angina pectoris: Secondary | ICD-10-CM | POA: Diagnosis not present

## 2019-08-29 DIAGNOSIS — I5022 Chronic systolic (congestive) heart failure: Secondary | ICD-10-CM | POA: Diagnosis not present

## 2019-09-05 IMAGING — CT CT HEAD W/O CM
3 series · 15 of 47 positions shown, 18 images · non-contrast
Comparison: CT HEAD August 17, 2014

CLINICAL DATA: Syncopal episode while making coffee this morning.
Fell dizzy last night. History of hypertension and
hypercholesterolemia.

EXAM:
CT HEAD WITHOUT CONTRAST
TECHNIQUE: Contiguous axial images were obtained from the base of the skull
through the vertex without intravenous contrast.

[Series 3: head wo · axial · 0.43mm/px · z∈[-79,+46]mm · 9 of 31 slices shown, 12 images]
[im 3/31  brain]
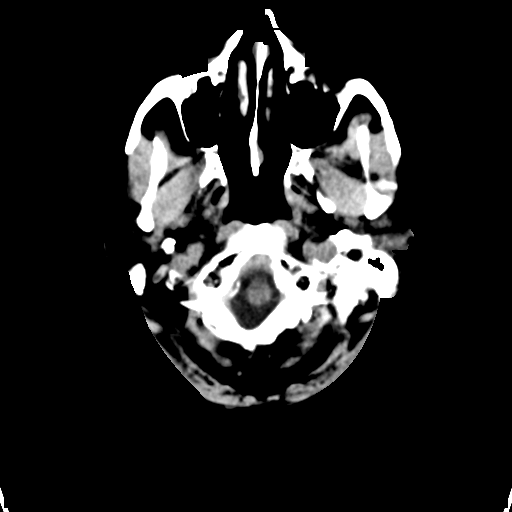
[im 3/31  bone]
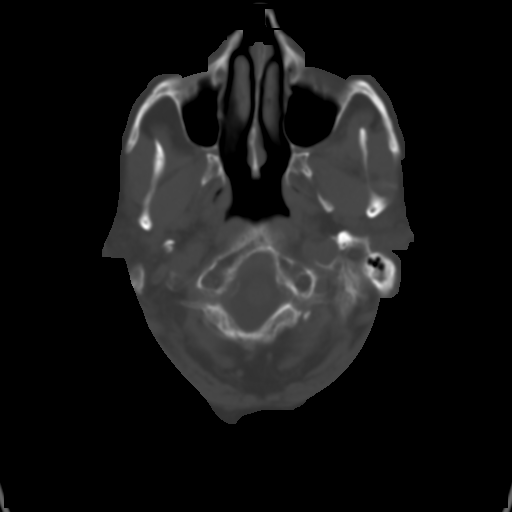
[im 6/31  brain]
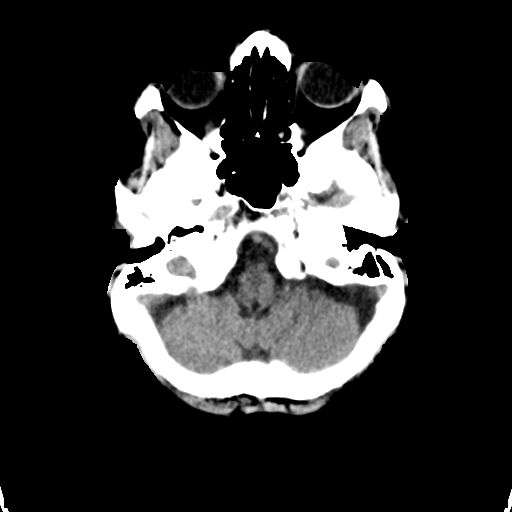
[im 9/31  brain]
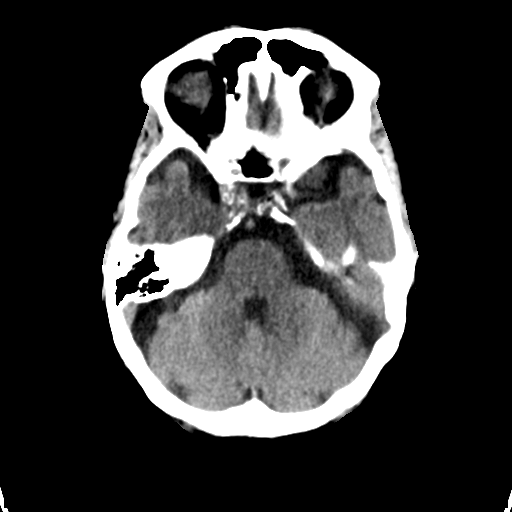
[im 12/31  brain]
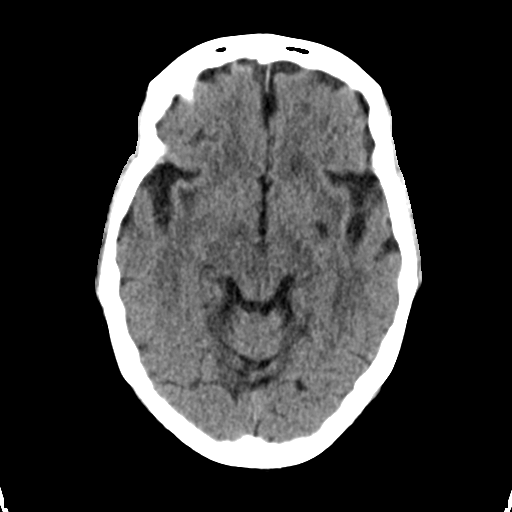
[im 16/31  brain]
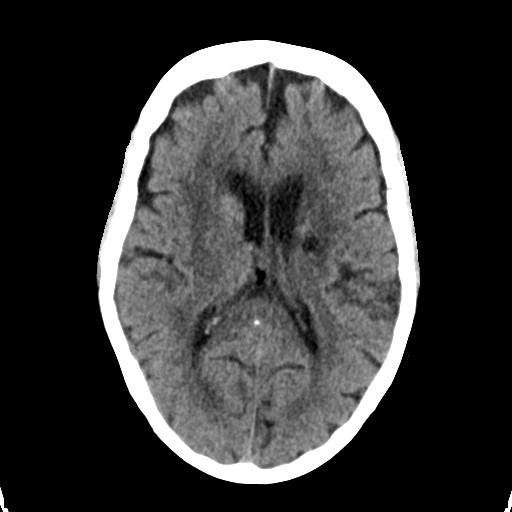
[im 16/31  bone]
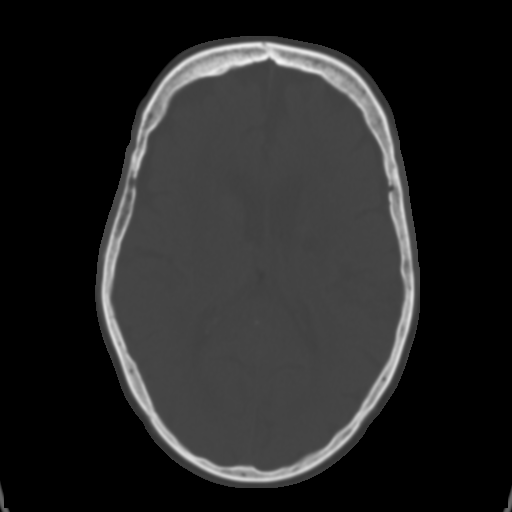
[im 19/31  brain]
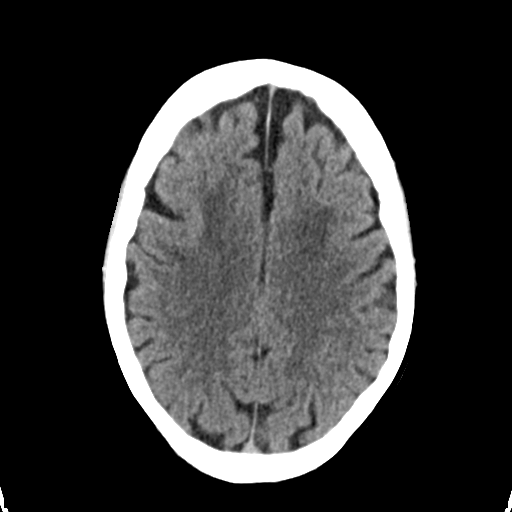
[im 22/31  brain]
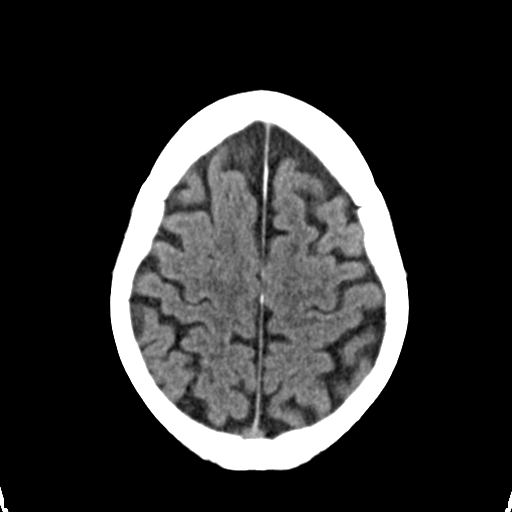
[im 25/31  brain]
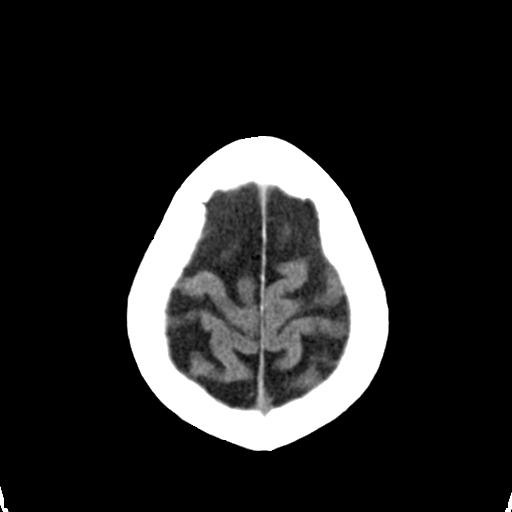
[im 28/31  brain]
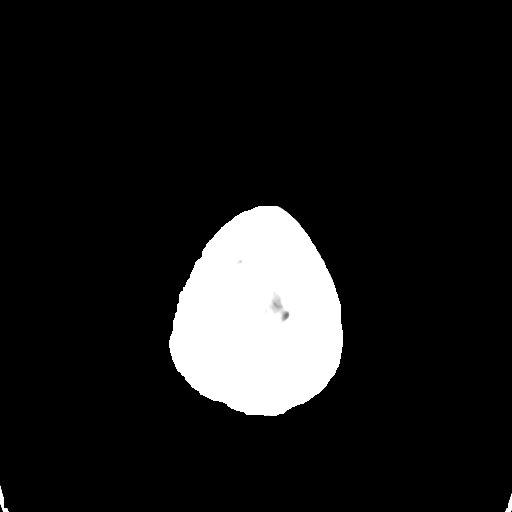
[im 28/31  bone]
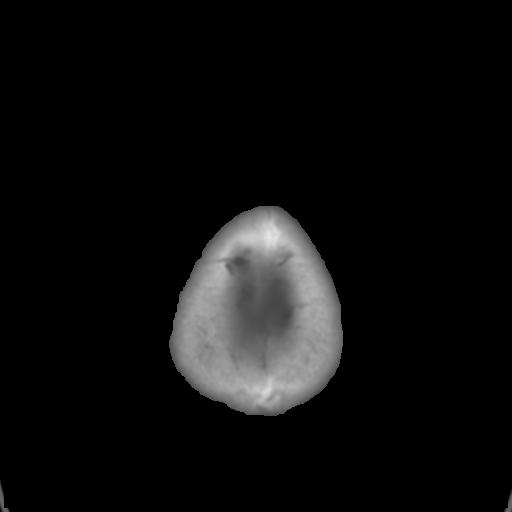

[Series 4: coronal soft tissue · coronal · 0.29mm/px · 3 of 67 slices shown]
[im 23/67  brain]
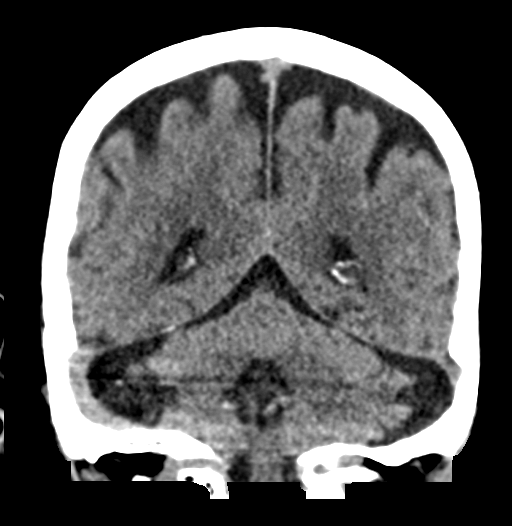
[im 30/67  brain]
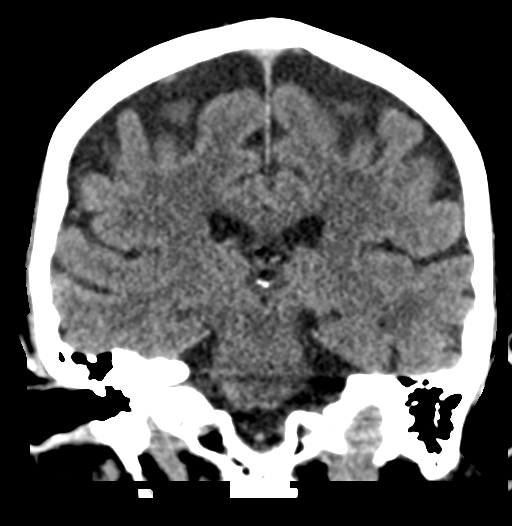
[im 37/67  brain]
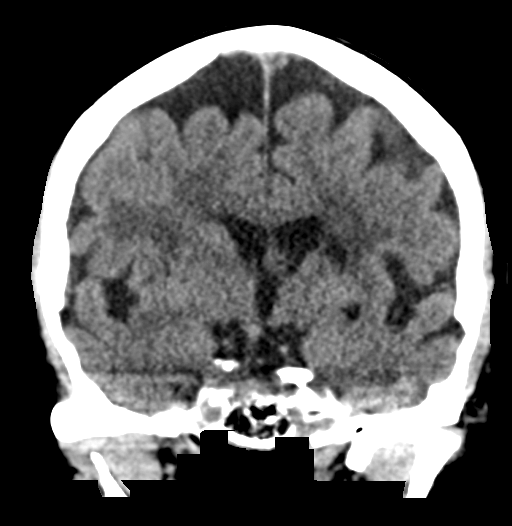

[Series 5: sagittal soft tissue · sagittal · 0.31mm/px · 3 of 51 slices shown]
[im 17/51  brain]
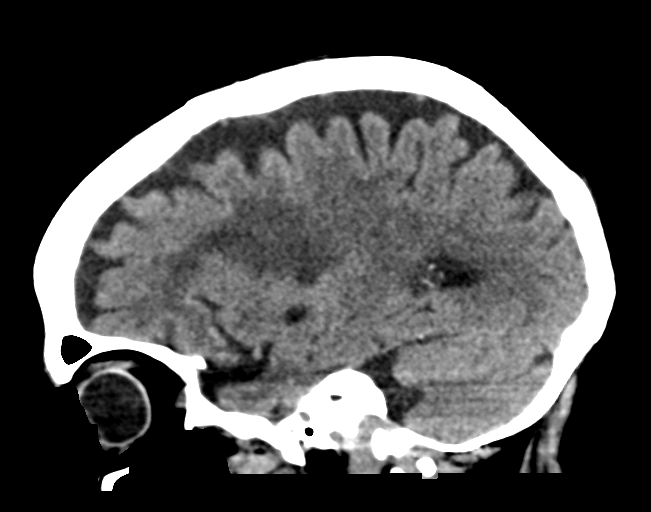
[im 26/51  brain]
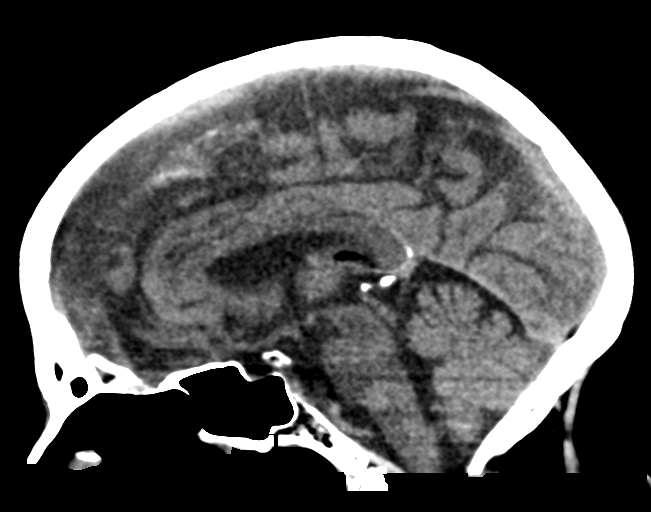
[im 34/51  brain]
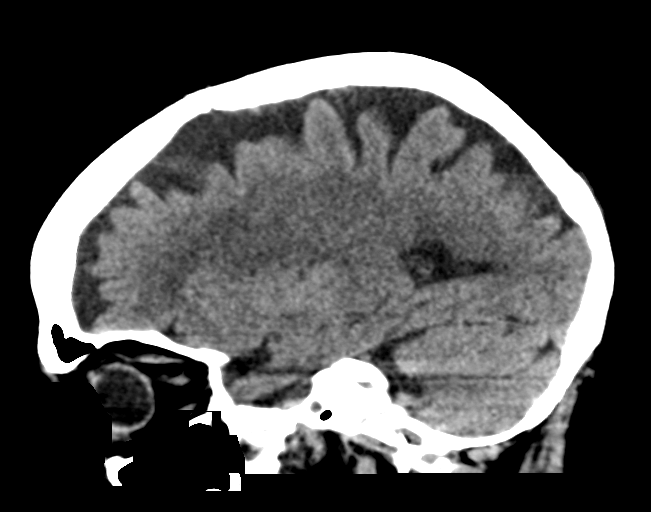

[15 of 47 positions shown; findings below may reference images not displayed]

FINDINGS: BRAIN: No intraparenchymal hemorrhage, mass effect nor midline
shift. Old LEFT basal ganglia infarct with ex vacuo dilatation LEFT
lateral ventricle, ventricles and sulci are overall normal for
patient's age. Patchy to comp supratentorial white matter
hypodensities are similar. No abnormal extra-axial fluid
collections. Basal cisterns are patent.

VASCULAR: Moderate calcific atherosclerosis of the carotid siphons.

SKULL: No skull fracture. Moderate temporomandibular osteoarthrosis.
Partially imaged atlantooccipital assimilation. No significant scalp
soft tissue swelling.

SINUSES/ORBITS: The mastoid air-cells and included paranasal sinuses
are well-aerated.The included ocular globes and orbital contents are
non-suspicious. Status post bilateral ocular lens implants.

OTHER: None.
IMPRESSION: 1. No acute intracranial process.
2. Old LEFT basal ganglia infarct. Moderate chronic small vessel
ischemic disease.

## 2019-09-10 IMAGING — DX DG CHEST 1V PORT
1 series · 1 of 1 positions shown · non-contrast
Comparison: None.

CLINICAL DATA: [AGE] female status post pacemaker.

EXAM:
PORTABLE CHEST 1 VIEW

[chest ap]
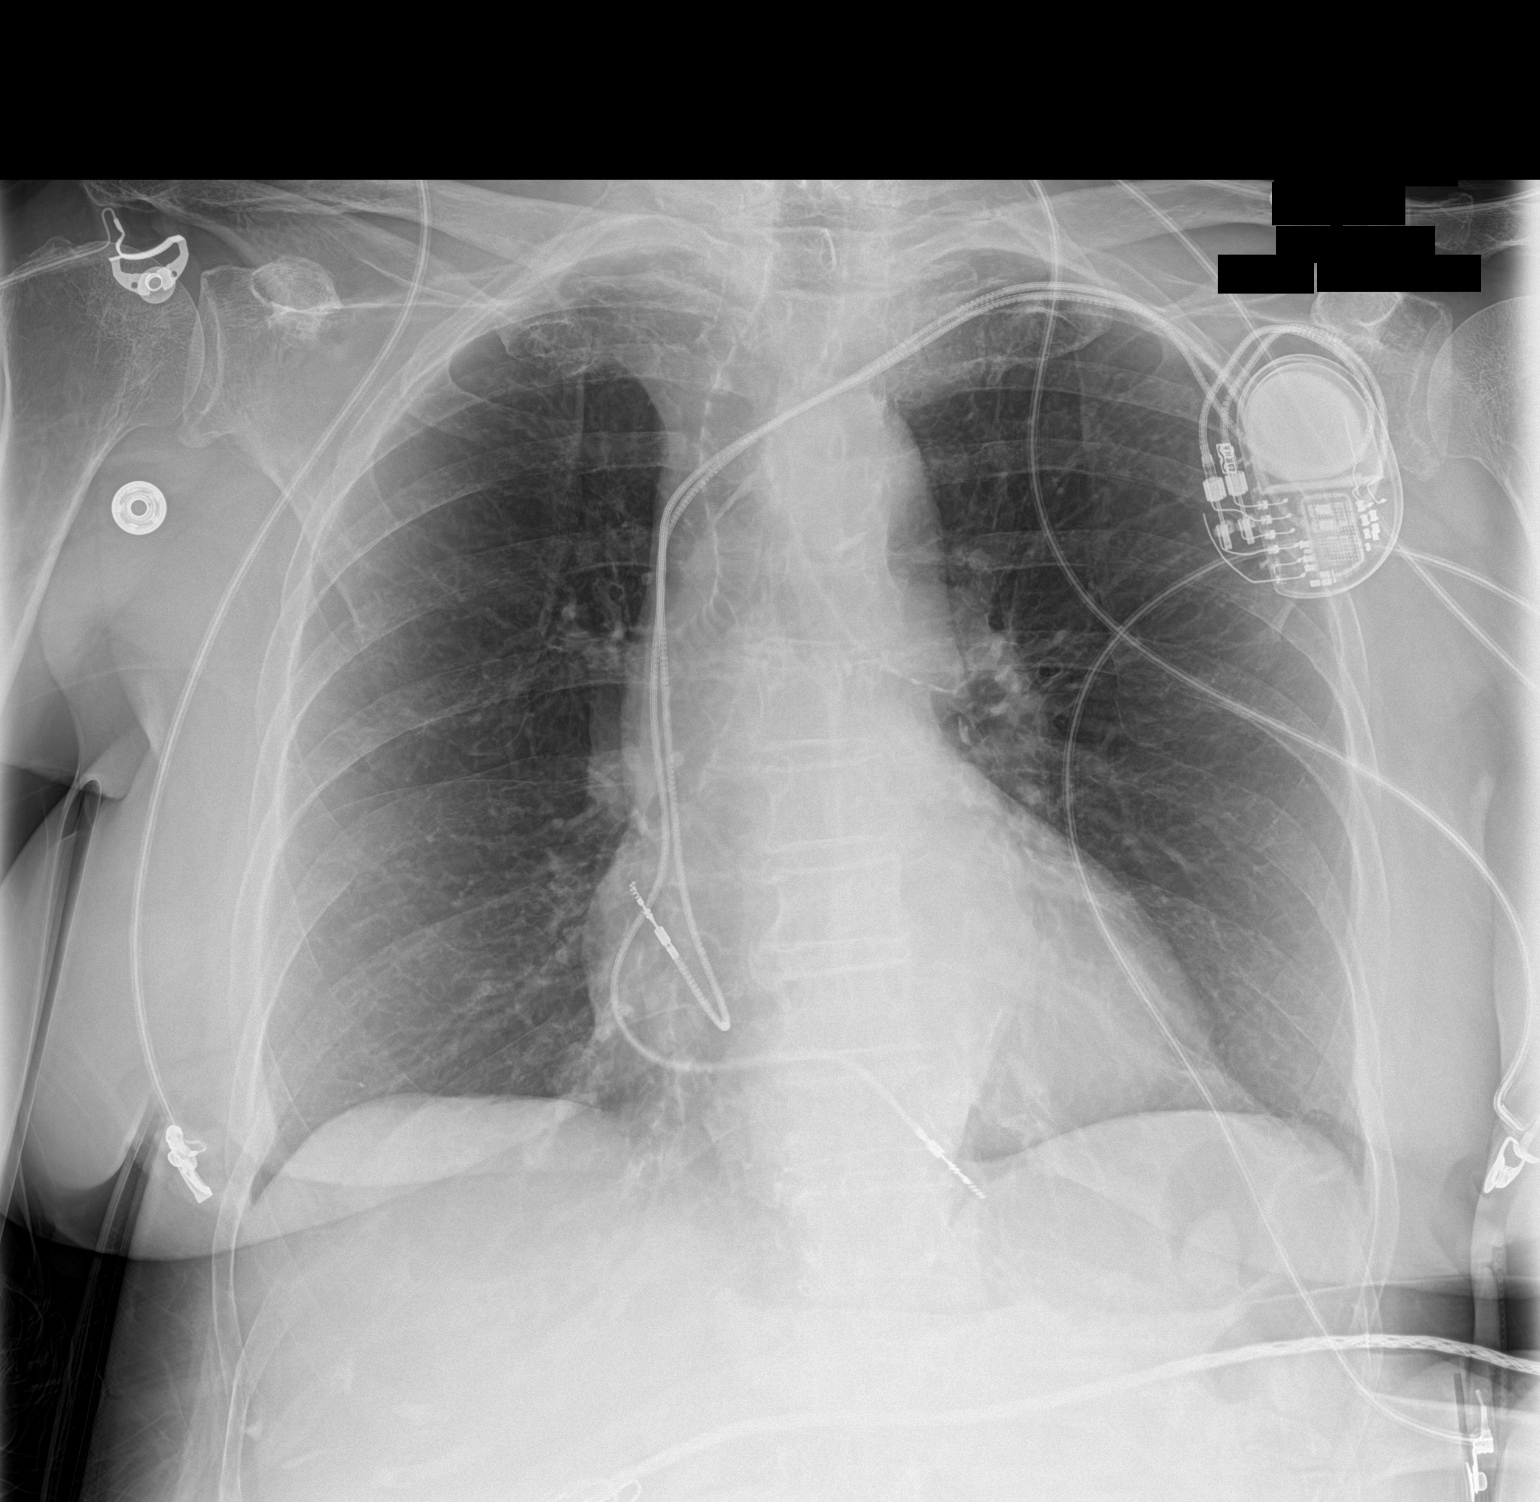

[1 of 1 positions shown; findings below may reference images not displayed]

FINDINGS: Portable AP upright view at 2552 hours. Left side tool lead cardiac
pacemaker. Leads course to the right atrium and RV apex region. No
pneumothorax. Allowing for portable technique the lungs are clear.
Mediastinal contours are normal aside from mild aortic tortuosity.
Calcified aortic atherosclerosis. Visualized tracheal air column is
within normal limits.
IMPRESSION: Left chest cardiac pacemaker with no adverse features.

## 2019-09-11 DIAGNOSIS — S41101A Unspecified open wound of right upper arm, initial encounter: Secondary | ICD-10-CM | POA: Diagnosis not present

## 2019-09-27 DIAGNOSIS — I10 Essential (primary) hypertension: Secondary | ICD-10-CM | POA: Diagnosis not present

## 2019-09-27 DIAGNOSIS — Z0000 Encounter for general adult medical examination without abnormal findings: Secondary | ICD-10-CM | POA: Diagnosis not present

## 2019-09-27 DIAGNOSIS — Z79899 Other long term (current) drug therapy: Secondary | ICD-10-CM | POA: Diagnosis not present

## 2019-09-27 DIAGNOSIS — I2510 Atherosclerotic heart disease of native coronary artery without angina pectoris: Secondary | ICD-10-CM | POA: Diagnosis not present

## 2019-09-27 DIAGNOSIS — E785 Hyperlipidemia, unspecified: Secondary | ICD-10-CM | POA: Diagnosis not present

## 2019-09-27 DIAGNOSIS — Z950 Presence of cardiac pacemaker: Secondary | ICD-10-CM | POA: Diagnosis not present

## 2019-10-18 DIAGNOSIS — I10 Essential (primary) hypertension: Secondary | ICD-10-CM | POA: Diagnosis not present

## 2019-10-18 DIAGNOSIS — Z950 Presence of cardiac pacemaker: Secondary | ICD-10-CM | POA: Diagnosis not present

## 2019-10-18 DIAGNOSIS — I739 Peripheral vascular disease, unspecified: Secondary | ICD-10-CM | POA: Diagnosis not present

## 2019-10-18 DIAGNOSIS — I2510 Atherosclerotic heart disease of native coronary artery without angina pectoris: Secondary | ICD-10-CM | POA: Diagnosis not present

## 2019-10-18 DIAGNOSIS — E785 Hyperlipidemia, unspecified: Secondary | ICD-10-CM | POA: Diagnosis not present

## 2019-11-15 DIAGNOSIS — Z961 Presence of intraocular lens: Secondary | ICD-10-CM | POA: Diagnosis not present

## 2019-11-15 DIAGNOSIS — H35342 Macular cyst, hole, or pseudohole, left eye: Secondary | ICD-10-CM | POA: Diagnosis not present

## 2020-01-30 DIAGNOSIS — I5022 Chronic systolic (congestive) heart failure: Secondary | ICD-10-CM | POA: Diagnosis not present

## 2020-04-17 DIAGNOSIS — R55 Syncope and collapse: Secondary | ICD-10-CM | POA: Diagnosis not present

## 2020-04-17 DIAGNOSIS — I10 Essential (primary) hypertension: Secondary | ICD-10-CM | POA: Diagnosis not present

## 2020-04-17 DIAGNOSIS — Z950 Presence of cardiac pacemaker: Secondary | ICD-10-CM | POA: Diagnosis not present

## 2020-04-17 DIAGNOSIS — R001 Bradycardia, unspecified: Secondary | ICD-10-CM | POA: Diagnosis not present

## 2020-04-17 DIAGNOSIS — E785 Hyperlipidemia, unspecified: Secondary | ICD-10-CM | POA: Diagnosis not present

## 2020-04-17 DIAGNOSIS — I2510 Atherosclerotic heart disease of native coronary artery without angina pectoris: Secondary | ICD-10-CM | POA: Diagnosis not present

## 2020-04-17 DIAGNOSIS — G459 Transient cerebral ischemic attack, unspecified: Secondary | ICD-10-CM | POA: Diagnosis not present

## 2020-05-02 DIAGNOSIS — Z79899 Other long term (current) drug therapy: Secondary | ICD-10-CM | POA: Diagnosis not present

## 2020-05-02 DIAGNOSIS — D519 Vitamin B12 deficiency anemia, unspecified: Secondary | ICD-10-CM | POA: Diagnosis not present

## 2020-05-02 DIAGNOSIS — E785 Hyperlipidemia, unspecified: Secondary | ICD-10-CM | POA: Diagnosis not present

## 2020-05-02 DIAGNOSIS — I10 Essential (primary) hypertension: Secondary | ICD-10-CM | POA: Diagnosis not present

## 2020-05-02 DIAGNOSIS — I700 Atherosclerosis of aorta: Secondary | ICD-10-CM | POA: Diagnosis not present

## 2020-05-02 DIAGNOSIS — Z0000 Encounter for general adult medical examination without abnormal findings: Secondary | ICD-10-CM | POA: Diagnosis not present

## 2020-05-02 DIAGNOSIS — I2510 Atherosclerotic heart disease of native coronary artery without angina pectoris: Secondary | ICD-10-CM | POA: Diagnosis not present

## 2020-05-08 DIAGNOSIS — E538 Deficiency of other specified B group vitamins: Secondary | ICD-10-CM | POA: Diagnosis not present

## 2020-05-15 DIAGNOSIS — E538 Deficiency of other specified B group vitamins: Secondary | ICD-10-CM | POA: Diagnosis not present

## 2020-05-22 DIAGNOSIS — E538 Deficiency of other specified B group vitamins: Secondary | ICD-10-CM | POA: Diagnosis not present

## 2020-06-07 DIAGNOSIS — E538 Deficiency of other specified B group vitamins: Secondary | ICD-10-CM | POA: Diagnosis not present

## 2020-06-25 DIAGNOSIS — R001 Bradycardia, unspecified: Secondary | ICD-10-CM | POA: Diagnosis not present

## 2020-07-10 DIAGNOSIS — E538 Deficiency of other specified B group vitamins: Secondary | ICD-10-CM | POA: Diagnosis not present

## 2020-07-31 DIAGNOSIS — Z79899 Other long term (current) drug therapy: Secondary | ICD-10-CM | POA: Diagnosis not present

## 2020-07-31 DIAGNOSIS — I2510 Atherosclerotic heart disease of native coronary artery without angina pectoris: Secondary | ICD-10-CM | POA: Diagnosis not present

## 2020-07-31 DIAGNOSIS — E7800 Pure hypercholesterolemia, unspecified: Secondary | ICD-10-CM | POA: Diagnosis not present

## 2020-07-31 DIAGNOSIS — I10 Essential (primary) hypertension: Secondary | ICD-10-CM | POA: Diagnosis not present

## 2020-07-31 DIAGNOSIS — Z0000 Encounter for general adult medical examination without abnormal findings: Secondary | ICD-10-CM | POA: Diagnosis not present

## 2020-07-31 DIAGNOSIS — E538 Deficiency of other specified B group vitamins: Secondary | ICD-10-CM | POA: Diagnosis not present

## 2020-08-14 DIAGNOSIS — E538 Deficiency of other specified B group vitamins: Secondary | ICD-10-CM | POA: Diagnosis not present

## 2020-09-18 DIAGNOSIS — E538 Deficiency of other specified B group vitamins: Secondary | ICD-10-CM | POA: Diagnosis not present

## 2020-10-23 DIAGNOSIS — E538 Deficiency of other specified B group vitamins: Secondary | ICD-10-CM | POA: Diagnosis not present

## 2020-11-06 DIAGNOSIS — H35342 Macular cyst, hole, or pseudohole, left eye: Secondary | ICD-10-CM | POA: Diagnosis not present

## 2020-11-15 DIAGNOSIS — N1832 Chronic kidney disease, stage 3b: Secondary | ICD-10-CM | POA: Diagnosis not present

## 2020-11-15 DIAGNOSIS — I700 Atherosclerosis of aorta: Secondary | ICD-10-CM | POA: Diagnosis not present

## 2020-11-15 DIAGNOSIS — I2510 Atherosclerotic heart disease of native coronary artery without angina pectoris: Secondary | ICD-10-CM | POA: Diagnosis not present

## 2020-11-15 DIAGNOSIS — I5022 Chronic systolic (congestive) heart failure: Secondary | ICD-10-CM | POA: Diagnosis not present

## 2020-11-15 DIAGNOSIS — R21 Rash and other nonspecific skin eruption: Secondary | ICD-10-CM | POA: Diagnosis not present

## 2020-11-15 DIAGNOSIS — E538 Deficiency of other specified B group vitamins: Secondary | ICD-10-CM | POA: Diagnosis not present

## 2020-11-15 DIAGNOSIS — E7800 Pure hypercholesterolemia, unspecified: Secondary | ICD-10-CM | POA: Diagnosis not present

## 2020-11-15 DIAGNOSIS — R8290 Unspecified abnormal findings in urine: Secondary | ICD-10-CM | POA: Diagnosis not present

## 2020-11-15 DIAGNOSIS — Z79899 Other long term (current) drug therapy: Secondary | ICD-10-CM | POA: Diagnosis not present

## 2020-11-26 DIAGNOSIS — I5022 Chronic systolic (congestive) heart failure: Secondary | ICD-10-CM | POA: Diagnosis not present

## 2020-11-27 DIAGNOSIS — E538 Deficiency of other specified B group vitamins: Secondary | ICD-10-CM | POA: Diagnosis not present

## 2021-01-01 DIAGNOSIS — E538 Deficiency of other specified B group vitamins: Secondary | ICD-10-CM | POA: Diagnosis not present

## 2021-01-16 ENCOUNTER — Emergency Department: Payer: PPO

## 2021-01-16 ENCOUNTER — Emergency Department
Admission: EM | Admit: 2021-01-16 | Discharge: 2021-01-16 | Disposition: A | Discharge status: Home / Self Care | Payer: PPO | Attending: Emergency Medicine | Admitting: Emergency Medicine

## 2021-01-16 ENCOUNTER — Other Ambulatory Visit: Payer: Self-pay

## 2021-01-16 DIAGNOSIS — F0390 Unspecified dementia without behavioral disturbance: Secondary | ICD-10-CM | POA: Insufficient documentation

## 2021-01-16 DIAGNOSIS — E860 Dehydration: Secondary | ICD-10-CM | POA: Insufficient documentation

## 2021-01-16 DIAGNOSIS — R0789 Other chest pain: Secondary | ICD-10-CM | POA: Diagnosis not present

## 2021-01-16 DIAGNOSIS — R000 Tachycardia, unspecified: Secondary | ICD-10-CM | POA: Diagnosis not present

## 2021-01-16 DIAGNOSIS — I10 Essential (primary) hypertension: Secondary | ICD-10-CM | POA: Diagnosis not present

## 2021-01-16 DIAGNOSIS — S22080A Wedge compression fracture of T11-T12 vertebra, initial encounter for closed fracture: Secondary | ICD-10-CM | POA: Diagnosis not present

## 2021-01-16 DIAGNOSIS — R110 Nausea: Secondary | ICD-10-CM | POA: Diagnosis not present

## 2021-01-16 DIAGNOSIS — Z20822 Contact with and (suspected) exposure to covid-19: Secondary | ICD-10-CM | POA: Insufficient documentation

## 2021-01-16 DIAGNOSIS — Z79899 Other long term (current) drug therapy: Secondary | ICD-10-CM | POA: Insufficient documentation

## 2021-01-16 DIAGNOSIS — Z7902 Long term (current) use of antithrombotics/antiplatelets: Secondary | ICD-10-CM | POA: Insufficient documentation

## 2021-01-16 DIAGNOSIS — K449 Diaphragmatic hernia without obstruction or gangrene: Secondary | ICD-10-CM | POA: Diagnosis not present

## 2021-01-16 DIAGNOSIS — R1084 Generalized abdominal pain: Secondary | ICD-10-CM | POA: Diagnosis not present

## 2021-01-16 DIAGNOSIS — R531 Weakness: Secondary | ICD-10-CM | POA: Diagnosis not present

## 2021-01-16 DIAGNOSIS — I2510 Atherosclerotic heart disease of native coronary artery without angina pectoris: Secondary | ICD-10-CM | POA: Diagnosis not present

## 2021-01-16 DIAGNOSIS — I959 Hypotension, unspecified: Secondary | ICD-10-CM | POA: Diagnosis not present

## 2021-01-16 DIAGNOSIS — Z951 Presence of aortocoronary bypass graft: Secondary | ICD-10-CM | POA: Diagnosis not present

## 2021-01-16 DIAGNOSIS — R1110 Vomiting, unspecified: Secondary | ICD-10-CM | POA: Diagnosis not present

## 2021-01-16 DIAGNOSIS — N200 Calculus of kidney: Secondary | ICD-10-CM | POA: Diagnosis not present

## 2021-01-16 DIAGNOSIS — R079 Chest pain, unspecified: Secondary | ICD-10-CM | POA: Diagnosis not present

## 2021-01-16 LAB — TROPONIN I (HIGH SENSITIVITY): Troponin I (High Sensitivity): 11 ng/L (ref <18)

## 2021-01-16 LAB — LIPASE, BLOOD: Lipase: 44 U/L (ref 11–51)

## 2021-01-16 LAB — URINALYSIS, COMPLETE (UACMP) WITH MICROSCOPIC
Bilirubin Urine: NEGATIVE
Glucose, UA: NEGATIVE mg/dL
Hgb urine dipstick: NEGATIVE
Ketones, ur: NEGATIVE mg/dL
Leukocytes,Ua: NEGATIVE
Nitrite: NEGATIVE
Protein, ur: NEGATIVE mg/dL
Specific Gravity, Urine: 1.015 (ref 1.005–1.030)
pH: 5 (ref 5.0–8.0)

## 2021-01-16 LAB — CBC
HCT: 32.2 % — ABNORMAL LOW (ref 36.0–46.0)
Hemoglobin: 10.8 g/dL — ABNORMAL LOW (ref 12.0–15.0)
MCH: 26.5 pg (ref 26.0–34.0)
MCHC: 33.5 g/dL (ref 30.0–36.0)
MCV: 78.9 fL — ABNORMAL LOW (ref 80.0–100.0)
Platelets: 266 10*3/uL (ref 150–400)
RBC: 4.08 MIL/uL (ref 3.87–5.11)
RDW: 15.5 % (ref 11.5–15.5)
WBC: 8.8 10*3/uL (ref 4.0–10.5)
nRBC: 0 % (ref 0.0–0.2)

## 2021-01-16 LAB — BASIC METABOLIC PANEL
Anion gap: 13 (ref 5–15)
BUN: 37 mg/dL — ABNORMAL HIGH (ref 8–23)
CO2: 25 mmol/L (ref 22–32)
Calcium: 9.4 mg/dL (ref 8.9–10.3)
Chloride: 101 mmol/L (ref 98–111)
Creatinine, Ser: 2.16 mg/dL — ABNORMAL HIGH (ref 0.44–1.00)
GFR, Estimated: 21 mL/min — ABNORMAL LOW (ref >60)
Glucose, Bld: 158 mg/dL — ABNORMAL HIGH (ref 70–99)
Potassium: 3.6 mmol/L (ref 3.5–5.1)
Sodium: 139 mmol/L (ref 135–145)

## 2021-01-16 LAB — HEPATIC FUNCTION PANEL
ALT: 7 U/L (ref 0–44)
AST: 21 U/L (ref 15–41)
Albumin: 3.9 g/dL (ref 3.5–5.0)
Alkaline Phosphatase: 38 U/L (ref 38–126)
Bilirubin, Direct: 0.2 mg/dL (ref 0.0–0.2)
Indirect Bilirubin: 0.7 mg/dL (ref 0.3–0.9)
Total Bilirubin: 0.9 mg/dL (ref 0.3–1.2)
Total Protein: 6.7 g/dL (ref 6.5–8.1)

## 2021-01-16 LAB — RESP PANEL BY RT-PCR (FLU A&B, COVID) ARPGX2
Influenza A by PCR: NEGATIVE
Influenza B by PCR: NEGATIVE
SARS Coronavirus 2 by RT PCR: NEGATIVE

## 2021-01-16 MED ORDER — ONDANSETRON 4 MG PO TBDP
4.0000 mg | ORAL_TABLET | Freq: Once | ORAL | Status: DC | PRN
Start: 2021-01-16 — End: 2021-01-16

## 2021-01-16 MED ORDER — LACTATED RINGERS IV BOLUS
1000.0000 mL | Freq: Once | INTRAVENOUS | Status: AC
  Administered 2021-01-16: 1000 mL via INTRAVENOUS

## 2021-01-16 NOTE — ED Notes (Signed)
Pt on toilet for BM.

## 2021-01-16 NOTE — ED Triage Notes (Signed)
Pt here via [facility] from home with weakness, nausea, and abd pain that started 45 mins ago. Pt took all of her morning meds and has a pacemaker. Pt has hx of a stroke 8 yrs ago, no deficits. CBG 182, temp 97.8, 109/74, 60.

## 2021-01-16 NOTE — ED Notes (Signed)
Pt to ED, daughter called EMS because pt is weak. Pt is alert and oriented.  Denies recent falls. States has mild abdominal pain that started this morning. Unsure of LBM, probably around 2d ago. Denies NVD.  Denies pain, burning with urination.   Pt states she just feels "kind of weak, just kind of sick".

## 2021-01-16 NOTE — ED Provider Notes (Addendum)
[facility] Emergency Department Provider Note  ____________________________________________   Event Date/Time   First MD Initiated Contact with Patient 01/16/21 0957     (approximate)  I have reviewed the triage vital signs and the nursing notes.   HISTORY  Chief Complaint Weakness and Nausea   HPI Teresa Wood is a 87 y.o. female with a past medical history of HTN, HDL, TIA, GERD, PVD, CAD, anemia, sick sinus syndrome, chronic venous stasis, and dementia who presents EMS from home after she reportedly had some abdominal pain earlier this morning.  Patient reportedly little nausea with the medicines well.  Patient states she does not remember exactly how she was feeling but this is feeling weak today.  Further history is limited on the patient secondary dementia.  She is after patient arrival her daughter did arrive bedside and confirmed patient is typically not oriented or able to remember much.  She did note that the patient has been complaining of some abdominal pain and generalized weakness earlier this morning.  Patient has not otherwise had any recent vomiting, diarrhea, dysuria, chest pain, cough, shortness of breath headache or earache, sore throat or recent falls or injuries.  She notes patient has been little constipated.  No other acute concerns at this time.         Past Medical History:  Diagnosis Date   Elevated cholesterol    GERD (gastroesophageal reflux disease)    Hypertension    TIA (transient ischemic attack)     Patient Active Problem List   Diagnosis Date Noted   Anemia, unspecified 04/15/2018   Benign hypertension 04/15/2018   Bradycardia 04/15/2018   CAD (coronary artery disease) 04/15/2018   Fibrocystic breast disease 04/15/2018   GI bleed 04/15/2018   History of degenerative disc disease 04/15/2018   Hyperlipidemia, unspecified 04/15/2018   PVD (peripheral vascular disease) ([place]) 04/15/2018   Venous stasis 04/15/2018    Pacemaker 03/25/2017   Sick sinus syndrome ([place]) 02/09/2017   Syncope 02/04/2017    Past Surgical History:  Procedure Laterality Date   ABDOMINAL HYSTERECTOMY     HIP SURGERY Left    PACEMAKER INSERTION Left 02/09/2017   Procedure: INSERTION PACEMAKER-DUAL CHAMBER;  Surgeon: Isaias Cowman, MD;  Location: [facility] ORS;  Service: Cardiovascular;  Laterality: Left;    Prior to Admission medications   Medication Sig Start Date End Date Taking? Authorizing Provider  amLODipine (NORVASC) 5 MG tablet Take 5 mg by mouth daily.    [provider]  benazepril-hydrochlorthiazide (LOTENSIN HCT) 20-12.5 MG tablet Take 1 tablet by mouth daily.    [provider]  CALCIUM PO Take 1 tablet by mouth daily.     [provider]  clopidogrel (PLAVIX) 75 MG tablet Take 75 mg by mouth daily.    [provider]  gabapentin (NEURONTIN) 100 MG capsule Take 100 mg by mouth 3 (three) times daily.  02/26/17 02/26/18  [provider]  naproxen sodium (ALEVE) 220 MG tablet Take by mouth.    [provider]  pantoprazole (PROTONIX) 40 MG tablet Take 40 mg by mouth daily.    [provider]  pravastatin (PRAVACHOL) 40 MG tablet Take 40 mg by mouth daily.    [provider]  selenium sulfide (SELSUN) 2.5 % shampoo Apply topically. 12/23/16   [provider]    Allergies Patient has no known allergies.  No family history on file.  Social History Social History   Tobacco Use   Smoking status: Never  Smokeless tobacco: Never  Substance Use Topics   Alcohol use: No   Drug use: No    Review of Systems  Review of Systems  Unable to perform ROS: Dementia  Constitutional:  Positive for malaise/fatigue.  Respiratory:  Negative for cough.   Cardiovascular:  Negative for chest pain.  Gastrointestinal:  Positive for abdominal pain, nausea and vomiting.  Neurological:  Positive for weakness.      ____________________________________________   PHYSICAL EXAM:  VITAL SIGNS: ED Triage Vitals  Enc Vitals Group     BP      Pulse      Resp      Temp      Temp src      SpO2      Weight      Height      Head Circumference      Peak Flow      Pain Score      Pain Loc      Pain Edu?      Excl. in [place]?    Vitals:   01/16/21 1000 01/16/21 1358  BP: (!) 123/52 120/62  Pulse:  64  Resp:  17  Temp:    SpO2:  98%   Physical Exam Vitals and nursing note reviewed.  Constitutional:      General: She is not in acute distress.    Appearance: She is well-developed.  HENT:     Head: Normocephalic and atraumatic.     Right Ear: External ear normal.     Left Ear: External ear normal.     Nose: Nose normal.     Mouth/Throat:     Mouth: Mucous membranes are dry.  Eyes:     Conjunctiva/sclera: Conjunctivae normal.  Cardiovascular:     Rate and Rhythm: Normal rate and regular rhythm.     Heart sounds: No murmur heard. Pulmonary:     Effort: Pulmonary effort is normal. No respiratory distress.     Breath sounds: Normal breath sounds.  Abdominal:     Palpations: Abdomen is soft.     Tenderness: There is no abdominal tenderness. There is no right CVA tenderness or left CVA tenderness.  Musculoskeletal:     Cervical back: Neck supple.  Skin:    General: Skin is warm and dry.     Capillary Refill: Capillary refill takes 2 to 3 seconds.  Neurological:     Mental Status: She is alert. Mental status is at baseline. She is disoriented and confused.     ____________________________________________   LABS (all labs ordered are listed, but only abnormal results are displayed)  Labs Reviewed  BASIC METABOLIC PANEL - Abnormal; Notable for the following components:      Result Value   Glucose, Bld 158 (*)    BUN 37 (*)    Creatinine, Ser 2.16 (*)    GFR, Estimated 21 (*)    All other components within normal limits  CBC - Abnormal; Notable for the following components:    Hemoglobin 10.8 (*)    HCT 32.2 (*)    MCV 78.9 (*)    All other components within normal limits  URINALYSIS, COMPLETE (UACMP) WITH MICROSCOPIC - Abnormal; Notable for the following components:   Color, Urine YELLOW (*)    APPearance CLEAR (*)    Bacteria, UA RARE (*)    All other components within normal limits  RESP PANEL BY RT-PCR (FLU A&B, COVID) ARPGX2  HEPATIC FUNCTION PANEL  LIPASE, BLOOD  CBG MONITORING,  ED  TROPONIN I (HIGH SENSITIVITY)  TROPONIN I (HIGH SENSITIVITY)   ____________________________________________  EKG  Atrially paced rhythm with PR interval of 242, left ventricular hypertrophy and some nonspecific ST changes with artifact inferior leads without other evidence of acute ischemia or significant arrhythmia. ____________________________________________  RADIOLOGY  ED MD interpretation: Chest x-ray shows large hiatal hernia and remote T11 vertebral body compression fracture without focal consolidation, effusion, edema, pneumothorax or any other clear acute thoracic process.  No evidence of acute fractures.  CT abdomen pelvis shows no evidence of mass, enlarged lymph nodes, abscess, diverticulitis, ureteral kidney stone, perinephric stranding, appendicitis, pancreatitis, cholecystitis or other acute process.  T11 remote compression fracture is again noted.  Official radiology report(s): CT ABDOMEN PELVIS WO CONTRAST  Result Date: 01/16/2021 CLINICAL DATA:  Weakness and abdominal pain since this morning. EXAM: CT ABDOMEN AND PELVIS WITHOUT CONTRAST TECHNIQUE: Multidetector CT imaging of the abdomen and pelvis was performed following the standard protocol without IV contrast. COMPARISON:  Prior study from 2011. FINDINGS: Lower chest: There is a large hiatal hernia. The heart is mildly enlarged. No pericardial effusion. Pacer wires are noted. Moderate aortic calcifications but no aneurysm. Minimal dependent subpleural atelectasis but no infiltrates or pulmonary lesions.  Hepatobiliary: No hepatic lesions intrahepatic biliary dilatation. The gallbladder is unremarkable. No common bile duct dilatation. Pancreas: No mass, inflammation ductal dilatation. Spleen: Normal size. No focal lesions. Adrenals/Urinary Tract: Adrenal glands and kidneys are unremarkable. Small bilateral renal calculi are noted. Simple appearing 2 cm cyst associated with the left kidney. No hydroureteronephrosis. The bladder is grossly normal. Stomach/Bowel: The stomach, duodenum, small bowel and colon are grossly normal without oral contrast. No acute inflammatory process, mass lesions or obstructive findings. Vascular/Lymphatic: Advanced atherosclerotic calcifications involving the aorta and branch vessels but no aneurysm. No mesenteric or retroperitoneal mass or adenopathy. Reproductive: The uterus is surgically absent. Both ovaries are still present and appear normal. Other: No pelvic mass or adenopathy. No free pelvic fluid collections. No inguinal mass or adenopathy. No abdominal wall hernia or subcutaneous lesions. Musculoskeletal: Age related osteoporosis. There is a severe compression fracture at T11 with vertebral plana appearance. This is likely remote. IMPRESSION: 1. No acute abdominal/pelvic findings, mass lesions or adenopathy. 2. Small bilateral renal calculi but no obstructing ureteral calculi or bladder calculi. 3. Large hiatal hernia. 4. Advanced atherosclerotic calcifications involving the aorta and branch vessels. 5. Compression fracture at T11 with vertebral plana appearance. This is likely remote. Aortic Atherosclerosis (ICD10-I70.0). Electronically Signed   By: Marijo Sanes M.D.   On: 01/16/2021 12:19   DG Chest 2 View  Result Date: 01/16/2021 CLINICAL DATA:  Weakness, vomiting EXAM: CHEST - 2 VIEW COMPARISON:  Chest radiograph 02/09/2017, CTA chest 03/05/2017 FINDINGS: A left chest wall cardiac device and 2 associated leads are stable. The cardiomediastinal silhouette is stable. There is  calcified atherosclerotic plaque of the aortic arch. There is no focal consolidation or pulmonary edema. Linear opacities in the left base likely reflect atelectasis or scar. There is no pleural effusion or pneumothorax. There is a large hiatal hernia. There is severe compression deformity of the T11 vertebral body, worsened since the prior CT from 2018. IMPRESSION: 1. No radiographic evidence of acute cardiopulmonary process. 2. Large hiatal hernia. 3. Severe compression deformity of the T11 vertebral body, worsened since 2018. Electronically Signed   By: Valetta Mole M.D.   On: 01/16/2021 11:08    ____________________________________________   PROCEDURES  Procedure(s) performed (including Critical Care):  .1-3 Lead EKG Interpretation Performed  by: Lucrezia Starch, MD Authorized by: Lucrezia Starch, MD     Interpretation: non-specific     ECG rate assessment: normal     Rhythm: paced     Ectopy: none     Conduction: abnormal     Abnormal conduction: 1st degree AV block     ____________________________________________   INITIAL IMPRESSION / ASSESSMENT AND PLAN / ED COURSE        Patient presents with above-stated history and exam after reportedly experiencing abdominal pain earlier this morning per daughter and some nausea and little bit of emesis with EMS.  Patient states she does not recall this and on arrival states she feels just weak.  On arrival she is afebrile hemodynamically stable.  She is awake and alert and at her neurological baseline.  Her abdomen is soft and nontender throughout and her lungs are clear bilaterally.  Differential includes acute gastritis, symptomatic hiatal hernia, cholecystitis, pancreatitis, diverticulitis, kidney stone, cystitis, pyonephritis, atypical presentation for ACS, pneumonia and SBO.   Chest x-ray shows large hiatal hernia and remote T11 vertebral body compression fracture without focal consolidation, effusion, edema, pneumothorax or any  other clear acute thoracic process.  No evidence of acute fractures.  CT abdomen pelvis shows no evidence of mass, enlarged lymph nodes, abscess, diverticulitis, ureteral kidney stone, perinephric stranding, appendicitis, pancreatitis, cholecystitis or other acute process.  T11 remote compression fracture is again noted.  CBC shows no leukocytosis and hemoglobin of 10.9 compared to 9.9 2 months ago.  BMP shows mild hyperglycemia without any other significant electrolyte or metabolic derangements.  Creatinine of 2.16 as compared to 2.1 on 11/15/2020.  Hepatic function panel has no evidence of cholestasis or hepatitis.  No focal tenderness or findings on CT to suggest cholecystitis.  Lipase not consistent with acute pancreatitis.  COVID and influenza PCR is negative.  UA with some rare bacteria but otherwise no convincing evidence of cystitis.  No CVA tenderness fever or leukocytosis to suggest pyelonephritis.  ECG and nonelevated troponin not suggestive of atypical presentation for ACS.  Overall unclear etiology of patient's symptoms although certainly possible she has a symptomatic hiatal hernia and given how large it is on imaging.  However on reassessment patient is tolerating p.o. and now denying any symptoms other than some persistent weakness.  Given stable vitals otherwise reassuring exam work-up I think she is stable for discharge with close outpatient PCP follow-up.  Daughter is amenable this plan.  Discharged stable condition.  Strict return precautions advised and discussed.   ____________________________________________   FINAL CLINICAL IMPRESSION(S) / ED DIAGNOSES  Final diagnoses:  Nausea  Generalized abdominal pain  Dehydration  Hiatal hernia    Medications  ondansetron (ZOFRAN-ODT) disintegrating tablet 4 mg (has no administration in time range)  lactated ringers bolus 1,000 mL (0 mLs Intravenous Stopped 01/16/21 1526)     ED Discharge Orders     None        Note:   This document was prepared using Dragon voice recognition software and may include unintentional dictation errors.    Lucrezia Starch, MD 01/16/21 1533    Lucrezia Starch, MD 01/16/21 [phone]

## 2021-01-22 DIAGNOSIS — N1832 Chronic kidney disease, stage 3b: Secondary | ICD-10-CM | POA: Diagnosis not present

## 2021-01-22 DIAGNOSIS — I2510 Atherosclerotic heart disease of native coronary artery without angina pectoris: Secondary | ICD-10-CM | POA: Diagnosis not present

## 2021-01-22 DIAGNOSIS — Z09 Encounter for follow-up examination after completed treatment for conditions other than malignant neoplasm: Secondary | ICD-10-CM | POA: Diagnosis not present

## 2021-01-22 DIAGNOSIS — R1084 Generalized abdominal pain: Secondary | ICD-10-CM | POA: Diagnosis not present

## 2021-02-05 DIAGNOSIS — E538 Deficiency of other specified B group vitamins: Secondary | ICD-10-CM | POA: Diagnosis not present

## 2021-02-19 ENCOUNTER — Other Ambulatory Visit: Payer: Self-pay

## 2021-02-19 ENCOUNTER — Ambulatory Visit: Payer: PPO | Admitting: Dermatology

## 2021-02-19 ENCOUNTER — Encounter: Payer: Self-pay | Admitting: Dermatology

## 2021-02-19 DIAGNOSIS — D485 Neoplasm of uncertain behavior of skin: Secondary | ICD-10-CM

## 2021-02-19 DIAGNOSIS — D0461 Carcinoma in situ of skin of right upper limb, including shoulder: Secondary | ICD-10-CM

## 2021-02-19 DIAGNOSIS — L578 Other skin changes due to chronic exposure to nonionizing radiation: Secondary | ICD-10-CM | POA: Diagnosis not present

## 2021-02-19 NOTE — Progress Notes (Signed)
   New Patient Visit  Subjective  Teresa Wood is a 87 y.o. female who presents for the following: Other (Spot of right shoulder that is tender).  Accompanied by caregiver, York Cerise who contributes to history.  The following portions of the chart were reviewed this encounter and updated as appropriate:   Tobacco  Allergies  Meds  Problems  Med Hx  Surg Hx  Fam Hx     Review of Systems:  No other skin or systemic complaints except as noted in HPI or Assessment and Plan.  Objective  Well appearing patient in no apparent distress; mood and affect are within normal limits.  A focused examination was performed including right arm. Relevant physical exam findings are noted in the Assessment and Plan.  Right Shoulder - Anterior 3.5 x 2.5 cm pink crusty plaque        Assessment & Plan  Neoplasm of uncertain behavior of skin Right Shoulder - Anterior  Skin / nail biopsy Type of biopsy: tangential   Informed consent: discussed and consent obtained   Timeout: patient name, date of birth, surgical site, and procedure verified   Procedure prep:  Patient was prepped and draped in usual sterile fashion Prep type:  Isopropyl alcohol Anesthesia: the lesion was anesthetized in a standard fashion   Anesthetic:  1% lidocaine w/ epinephrine 1-100,000 buffered w/ 8.4% NaHCO3 Instrument used: flexible razor blade   Hemostasis achieved with: pressure, aluminum chloride and electrodesiccation   Outcome: patient tolerated procedure well   Post-procedure details: sterile dressing applied and wound care instructions given   Dressing type: bandage and petrolatum    Specimen 1 - Surgical pathology Differential Diagnosis: Psoriasis vs SCC vs other  Check Margins: No  Actinic Damage - chronic, secondary to cumulative UV radiation exposure/sun exposure over time - diffuse scaly erythematous macules with underlying dyspigmentation - Recommend daily broad spectrum sunscreen SPF 30+ to  sun-exposed areas, reapply every 2 hours as needed.  - Recommend staying in the shade or wearing long sleeves, sun glasses (UVA+UVB protection) and wide brim hats (4-inch brim around the entire circumference of the hat). - Call for new or changing lesions.  Return pending biopsy results.  I, Ashok Cordia, CMA, am acting as scribe for Sarina Ser, MD . Documentation: I have reviewed the above documentation for accuracy and completeness, and I agree with the above.  Sarina Ser, MD

## 2021-02-19 NOTE — Patient Instructions (Signed)
Wound Care Instructions  Cleanse wound gently with soap and water once a day then pat dry with clean gauze. Apply a thing coat of Petrolatum (petroleum jelly, "Vaseline") over the wound (unless you have an allergy to this). We recommend that you use a new, sterile tube of Vaseline. Do not pick or remove scabs. Do not remove the yellow or white "healing tissue" from the base of the wound.  Cover the wound with fresh, clean, nonstick gauze and secure with paper tape. You may use Band-Aids in place of gauze and tape if the would is small enough, but would recommend trimming much of the tape off as there is often too much. Sometimes Band-Aids can irritate the skin.  You should call the office for your biopsy report after 1 week if you have not already been contacted.  If you experience any problems, such as abnormal amounts of bleeding, swelling, significant bruising, significant pain, or evidence of infection, please call the office immediately.  FOR ADULT SURGERY PATIENTS: If you need something for pain relief you may take 1 extra strength Tylenol (acetaminophen) AND 2 Ibuprofen (200mg each) together every 4 hours as needed for pain. (do not take these if you are allergic to them or if you have a reason you should not take them.) Typically, you may only need pain medication for 1 to 3 days.   If you have any questions or concerns for your doctor, please call our main line at [phone] and press option 4 to reach your doctor's medical assistant. If no one answers, please leave a voicemail as directed and we will return your call as soon as possible. Messages left after 4 pm will be answered the following business day.   You may also send us a message via MyChart. We typically respond to MyChart messages within 1-2 business days.  For prescription refills, please ask your pharmacy to contact our office. Our fax number is [phone].  If you have an urgent issue when the clinic is closed that  cannot wait until the next business day, you can page your doctor at the number below.    Please note that while we do our best to be available for urgent issues outside of office hours, we are not available 24/7.   If you have an urgent issue and are unable to reach us, you may choose to seek medical care at your doctor's office, retail clinic, urgent care center, or emergency room.  If you have a medical emergency, please immediately call 911 or go to the emergency department.  Pager Numbers  - Dr. Kowalski: [phone]  - Dr. Moye: [phone]  - Dr. Stewart: [phone]  In the event of inclement weather, please call our main line at [phone] for an update on the status of any delays or closures.  Dermatology Medication Tips: Please keep the boxes that topical medications come in in order to help keep track of the instructions about where and how to use these. Pharmacies typically print the medication instructions only on the boxes and not directly on the medication tubes.   If your medication is too expensive, please contact our office at [phone] option 4 or send us a message through MyChart.   We are unable to tell what your co-pay for medications will be in advance as this is different depending on your insurance coverage. However, we may be able to find a substitute medication at lower cost or fill out paperwork to get insurance to cover a needed   medication.   If a prior authorization is required to get your medication covered by your insurance company, please allow us 1-2 business days to complete this process.  Drug prices often vary depending on where the prescription is filled and some pharmacies may offer cheaper prices.  The website www.goodrx.com contains coupons for medications through different pharmacies. The prices here do not account for what the cost may be with help from insurance (it may be cheaper with your insurance), but the website can give you the  price if you did not use any insurance.  - You can print the associated coupon and take it with your prescription to the pharmacy.  - You may also stop by our office during regular business hours and pick up a GoodRx coupon card.  - If you need your prescription sent electronically to a different pharmacy, notify our office through [place] MyChart or by phone at [phone] option 4.   

## 2021-02-20 DIAGNOSIS — C4492 Squamous cell carcinoma of skin, unspecified: Secondary | ICD-10-CM

## 2021-02-20 HISTORY — DX: Squamous cell carcinoma of skin, unspecified: C44.92

## 2021-02-21 ENCOUNTER — Telehealth: Payer: Self-pay

## 2021-02-21 NOTE — Telephone Encounter (Signed)
-----   Message from Ralene Bathe, MD sent at 02/20/2021  5:56 PM EDT ----- Diagnosis Skin , right shoulder - anterior SQUAMOUS CELL CARCINOMA IN SITU, HYPERTROPHIC, BASE INVOLVED  Cancer - SCC in situ Superficial Schedule for treatment (shave removal and EDC)

## 2021-02-21 NOTE — Telephone Encounter (Signed)
Left message on voicemail to return my call.  

## 2021-03-03 ENCOUNTER — Telehealth: Payer: Self-pay

## 2021-03-03 NOTE — Telephone Encounter (Signed)
-----   Message from Ralene Bathe, MD sent at 02/20/2021  5:56 PM EDT ----- Diagnosis Skin , right shoulder - anterior SQUAMOUS CELL CARCINOMA IN SITU, HYPERTROPHIC, BASE INVOLVED  Cancer - SCC in situ Superficial Schedule for treatment (shave removal and EDC)

## 2021-03-03 NOTE — Telephone Encounter (Signed)
Patient's daughter advised of BX results. Scheduled patient when she will be back in town to come with mother.

## 2021-03-12 DIAGNOSIS — E538 Deficiency of other specified B group vitamins: Secondary | ICD-10-CM | POA: Diagnosis not present

## 2021-03-27 ENCOUNTER — Other Ambulatory Visit: Payer: Self-pay

## 2021-03-27 ENCOUNTER — Ambulatory Visit: Payer: PPO | Admitting: Dermatology

## 2021-03-27 DIAGNOSIS — D0461 Carcinoma in situ of skin of right upper limb, including shoulder: Secondary | ICD-10-CM

## 2021-03-27 DIAGNOSIS — L309 Dermatitis, unspecified: Secondary | ICD-10-CM | POA: Diagnosis not present

## 2021-03-27 DIAGNOSIS — L821 Other seborrheic keratosis: Secondary | ICD-10-CM | POA: Diagnosis not present

## 2021-03-27 DIAGNOSIS — D099 Carcinoma in situ, unspecified: Secondary | ICD-10-CM

## 2021-03-27 DIAGNOSIS — L308 Other specified dermatitis: Secondary | ICD-10-CM

## 2021-03-27 MED ORDER — MOMETASONE FUROATE 0.1 % EX CREA: 1.0000 "application " | TOPICAL_CREAM | Freq: Every day | CUTANEOUS | 3 refills | Status: AC | PRN

## 2021-03-27 MED ORDER — MUPIROCIN 2 % EX OINT: TOPICAL_OINTMENT | CUTANEOUS | 0 refills | Status: DC

## 2021-03-27 NOTE — Patient Instructions (Addendum)
For Eczema itchy skin-Mometasone cream apply to skin once or twice day  as needed for itchy    For biopsy area on the right shoulder- Mupirocin-Bactroban  ointment apply to clean dry skin on the right shoulder once or twice a day, during each bandaid change     Gentle Skin Care Guide  1. Bathe no more than once a day.  2. Avoid bathing in hot water  3. Use a mild soap like Dove, Vanicream, Cetaphil, CeraVe. Can use Lever 2000 or Cetaphil antibacterial soap  4. Use soap only where you need it. On most days, use it under your arms, between your legs, and on your feet. Let the water rinse other areas unless visibly dirty.  5. When you get out of the bath/shower, use a towel to gently blot your skin dry, don't rub it.  6. While your skin is still a little damp, apply a moisturizing cream such as Vanicream, CeraVe, Cetaphil, Eucerin, Sarna lotion or plain Vaseline Jelly. For hands apply Neutrogena [place] Hand Cream or Excipial Hand Cream.  7. Reapply moisturizer any time you start to itch or feel dry.  8. Sometimes using free and clear laundry detergents can be helpful. Fabric softener sheets should be avoided. Downy Free & Gentle liquid, or any liquid fabric softener that is free of dyes and perfumes, it acceptable to use  9. If your doctor has given you prescription creams you may apply moisturizers over them       Wound Care Instructions  Cleanse wound gently with soap and water once a day then pat dry with clean gauze. Apply a thing coat Mupirocin ointment over the wound (unless you have an allergy to this). We recommend that you use a new, sterile tube of Vaseline. Do not pick or remove scabs. Do not remove the yellow or white "healing tissue" from the base of the wound.  Cover the wound with fresh, clean, nonstick gauze and secure with paper tape. You may use Band-Aids in place of gauze and tape if the would is small enough, but would recommend trimming much of the tape off  as there is often too much. Sometimes Band-Aids can irritate the skin.  You should call the office for your biopsy report after 1 week if you have not already been contacted.  If you experience any problems, such as abnormal amounts of bleeding, swelling, significant bruising, significant pain, or evidence of infection, please call the office immediately.  FOR ADULT SURGERY PATIENTS: If you need something for pain relief you may take 1 extra strength Tylenol (acetaminophen) AND 2 Ibuprofen (200mg  each) together every 4 hours as needed for pain. (do not take these if you are allergic to them or if you have a reason you should not take them.) Typically, you may only need pain medication for 1 to 3 days.        If You Need Anything After Your Visit  If you have any questions or concerns for your doctor, please call our main line at [phone] and press option 4 to reach your doctor's medical assistant. If no one answers, please leave a voicemail as directed and we will return your call as soon as possible. Messages left after 4 pm will be answered the following business day.   You may also send [place] a message via [place]. We typically respond to MyChart messages within 1-2 business days.  For prescription refills, please ask your pharmacy to contact our office. Our fax number is [phone].  If you have an urgent issue when the clinic is closed that cannot wait until the next business day, you can page your doctor at the number below.    Please note that while we do our best to be available for urgent issues outside of office hours, we are not available 24/7.   If you have an urgent issue and are unable to reach [place], you may choose to seek medical care at your doctor's office, retail clinic, urgent care center, or emergency room.  If you have a medical emergency, please immediately call 911 or go to the emergency department.  Pager Numbers  - Dr. Nehemiah Massed: [phone]  - Dr. Laurence Ferrari:  [phone]  - Dr. Nicole Kindred: [phone]  In the event of inclement weather, please call our main line at [phone] for an update on the status of any delays or closures.  Dermatology Medication Tips: Please keep the boxes that topical medications come in in order to help keep track of the instructions about where and how to use these. Pharmacies typically print the medication instructions only on the boxes and not directly on the medication tubes.   If your medication is too expensive, please contact our office at [phone] option 4 or send [place] a message through [place].   We are unable to tell what your co-pay for medications will be in advance as this is different depending on your insurance coverage. However, we may be able to find a substitute medication at lower cost or fill out paperwork to get insurance to cover a needed medication.   If a prior authorization is required to get your medication covered by your insurance company, please allow [place] 1-2 business days to complete this process.  Drug prices often vary depending on where the prescription is filled and some pharmacies may offer cheaper prices.  The website www.goodrx.com contains coupons for medications through different pharmacies. The prices here do not account for what the cost may be with help from insurance (it may be cheaper with your insurance), but the website can give you the price if you did not use any insurance.  - You can print the associated coupon and take it with your prescription to the pharmacy.  - You may also stop by our office during regular business hours and pick up a GoodRx coupon card.  - If you need your prescription sent electronically to a different pharmacy, notify our office through [facility] or by phone at [phone] option 4.     Si Usted Necesita Algo Despus de Su Visita  Tambin puede enviarnos un mensaje a travs de Pharmacist, community. Por lo general respondemos a los mensajes de  MyChart en el transcurso de 1 a 2 das hbiles.  Para renovar recetas, por favor pida a su farmacia que se ponga en contacto con nuestra oficina. Harland Dingwall de fax es [place] [phone].  Si tiene un asunto urgente cuando la clnica est cerrada y que no puede esperar hasta el siguiente da hbil, puede llamar/localizar a su doctor(a) al nmero que aparece a continuacin.   Por favor, tenga en cuenta que aunque hacemos todo lo posible para estar disponibles para asuntos urgentes fuera del horario de [place], no estamos disponibles las 24 horas del da, los 7 das de la [place].   Si tiene un problema urgente y no puede comunicarse con nosotros, puede optar por buscar atencin mdica  en el consultorio de su doctor(a), en una clnica privada, en un centro de atencin urgente o en  una sala de emergencias.  Si tiene Engineering geologist, por favor llame inmediatamente al 911 o vaya a la sala de emergencias.  Nmeros de bper  - Dr. Nehemiah Massed: [phone]  - Dra. Moye: [phone]  - Dra. Nicole Kindred: [phone]  En caso de inclemencias del [place], por favor llame a Johnsie Kindred principal al [phone] para una actualizacin sobre el [place] de cualquier retraso o cierre.  Consejos para la medicacin en dermatologa: Por favor, guarde las cajas en las que vienen los medicamentos de uso tpico para ayudarle a seguir las instrucciones sobre dnde y cmo usarlos. Las farmacias generalmente imprimen las instrucciones del medicamento slo en las cajas y no directamente en los tubos del [place].   Si su medicamento es muy caro, por favor, pngase en contacto con Zigmund Daniel llamando al [phone] y presione la opcin 4 o envenos un mensaje a travs de Pharmacist, community.   No podemos decirle cul ser su copago por los medicamentos por adelantado ya que esto es diferente dependiendo de la cobertura de su seguro. Sin embargo, es posible que podamos encontrar un medicamento sustituto a Contractor un formulario para que el seguro cubra el medicamento que se considera necesario.   Si se requiere una autorizacin previa para que su compaa de seguros [place] su medicamento, por favor permtanos de 1 a 2 das hbiles para completar este proceso.  Los precios de los medicamentos varan con frecuencia dependiendo del Environmental consultant de dnde se surte la receta y alguna farmacias pueden ofrecer precios ms baratos.  El sitio web www.goodrx.com tiene cupones para medicamentos de Airline pilot. Los precios aqu no tienen en cuenta lo que podra costar con la ayuda del seguro (puede ser ms barato con su seguro), pero el sitio web puede darle el precio si no utiliz Research scientist (physical sciences).  - Puede imprimir el cupn correspondiente y llevarlo con su receta a la farmacia.  - Tambin puede pasar por nuestra oficina durante el horario de atencin regular y Charity fundraiser una tarjeta de cupones de GoodRx.  - Si necesita que su receta se enve electrnicamente a una farmacia diferente, informe a nuestra oficina a travs de MyChart de [place] o por telfono llamando al [phone] y presione la opcin 4.

## 2021-03-27 NOTE — Progress Notes (Signed)
   Follow-Up Visit   Subjective  Teresa Wood is a 87 y.o. female who presents for the following: Skin Cancer (Biopsy proven SCCis at the right anterior shoulder, pt here for treatment) and Skin Problem (Check itchy areas on the back and arms ). The patient has spots, moles and lesions to be evaluated, some may be new or changing and the patient has concerns that these could be cancer.  Daughter with pt and contributes to history.  The following portions of the chart were reviewed this encounter and updated as appropriate:   Tobacco  Allergies  Meds  Problems  Med Hx  Surg Hx  Fam Hx     Review of Systems:  No other skin or systemic complaints except as noted in HPI or Assessment and Plan.  Objective  Well appearing patient in no apparent distress; mood and affect are within normal limits.  A focused examination was performed including face,back,arms. Relevant physical exam findings are noted in the Assessment and Plan.  Right Shoulder - Anterior Keratotic pink papule/nodule or plaque.   arms,chest,back Scaly erythematous papules and patches +/- dyspigmentation, lichenification, excoriations.    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 - Anterior  Destruction of lesion  Destruction method: cryotherapy   Informed consent: discussed and consent obtained   Timeout:  patient name, date of birth, surgical site, and procedure verified Anesthesia: the lesion was anesthetized in a standard fashion   Anesthetic:  1% lidocaine w/ epinephrine 1-100,000 buffered w/ 8.4% NaHCO3 Lesion length (cm):  3.5 Lesion width (cm):  3.5 Margin per side (cm):  0.2 Final wound size (cm):  3.9 Outcome: patient tolerated procedure well with no complications   Post-procedure details: sterile dressing applied and wound care instructions given   Dressing type: bacitracin    Biopsy proven SCCis   Discussed treatment options EDC, LN2, topical chemo treatment    We treat with LN2 today We will recheck in 2 months, we may consider treating with 5FU   Related Medications mupirocin ointment (BACTROBAN) 2 % Apply to skin qd-bid  mometasone (ELOCON) 0.1 % cream Apply 1 application topically daily as needed (Rash). Apply to itchy eczema skin once or twice a day as needed  Eczema arms,chest,back Atopic dermatitis (eczema) is a chronic, relapsing, pruritic condition that can significantly affect quality of life. It is often associated with allergic rhinitis and/or asthma and can require treatment with topical medications, phototherapy, or in severe cases biologic injectable medication (Dupixent; Adbry) or Oral JAK inhibitors.   Start Mometasone cream apply to itchy eczema skin once or twice a day   Seborrheic Keratoses Back, arms  - Stuck-on, waxy, tan-brown papules and/or plaques  - Benign-appearing - Discussed benign etiology and prognosis. - Observe - Call for any changes  Return in about 2 months (around 05/27/2021) for SCCis on the right anterior shoulder .  IMarye Round, CMA, am acting as scribe for Sarina Ser, MD .  Documentation: I have reviewed the above documentation for accuracy and completeness, and I agree with the above.  Sarina Ser, MD

## 2021-04-11 ENCOUNTER — Encounter: Payer: Self-pay | Admitting: Dermatology

## 2021-04-16 DIAGNOSIS — E538 Deficiency of other specified B group vitamins: Secondary | ICD-10-CM | POA: Diagnosis not present

## 2021-04-23 DIAGNOSIS — R001 Bradycardia, unspecified: Secondary | ICD-10-CM | POA: Diagnosis not present

## 2021-05-28 ENCOUNTER — Other Ambulatory Visit: Payer: Self-pay

## 2021-05-28 ENCOUNTER — Ambulatory Visit (INDEPENDENT_AMBULATORY_CARE_PROVIDER_SITE_OTHER): Payer: PPO | Admitting: Dermatology

## 2021-05-28 DIAGNOSIS — T148XXA Other injury of unspecified body region, initial encounter: Secondary | ICD-10-CM

## 2021-05-28 DIAGNOSIS — L98491 Non-pressure chronic ulcer of skin of other sites limited to breakdown of skin: Secondary | ICD-10-CM | POA: Diagnosis not present

## 2021-05-28 DIAGNOSIS — L578 Other skin changes due to chronic exposure to nonionizing radiation: Secondary | ICD-10-CM | POA: Diagnosis not present

## 2021-05-28 DIAGNOSIS — Z86007 Personal history of in-situ neoplasm of skin: Secondary | ICD-10-CM

## 2021-05-28 DIAGNOSIS — R234 Changes in skin texture: Secondary | ICD-10-CM | POA: Diagnosis not present

## 2021-05-28 NOTE — Patient Instructions (Signed)

## 2021-05-28 NOTE — Progress Notes (Addendum)
° °  Follow-Up Visit   Subjective  Teresa Wood is a 88 y.o. female who presents for the following: Follow-up (SCC in situ of right ant shoulder treated with LN2 03/27/21). The pt and daughter are concerned it has not healed.  Accompanied by daughter, Eustaquio Maize who contributes to history.  The following portions of the chart were reviewed this encounter and updated as appropriate:   Tobacco   Allergies   Meds   Problems   Med Hx   Surg Hx   Fam Hx      Review of Systems:  No other skin or systemic complaints except as noted in HPI or Assessment and Plan.  Objective  Well appearing patient in no apparent distress; mood and affect are within normal limits.  A focused examination was performed including right shoulder. Relevant physical exam findings are noted in the Assessment and Plan.  Right ant shoulder Large crust  at site of SCCis treatment  - after debridement there is small ulcer  Assessment & Plan   Actinic Damage - chronic, secondary to cumulative UV radiation exposure/sun exposure over time - diffuse scaly erythematous macules with underlying dyspigmentation - Recommend daily broad spectrum sunscreen SPF 30+ to sun-exposed areas, reapply every 2 hours as needed.  - Recommend staying in the shade or wearing long sleeves, sun glasses (UVA+UVB protection) and wide brim hats (4-inch brim around the entire circumference of the hat). - Call for new or changing lesions.  History of squamous cell carcinoma in situ (SCCIS) of skin Right ant shoulder With large crust. Debrided today. Area has small ulcer after debridement today. Recheck on follow up.  Wound of skin  Return in about 2 months (around 07/26/2021).  I, Ashok Cordia, CMA, am acting as scribe for Sarina Ser, MD . Documentation: I have reviewed the above documentation for accuracy and completeness, and I agree with the above.  Sarina Ser, MD

## 2021-05-29 ENCOUNTER — Encounter: Payer: Self-pay | Admitting: Dermatology

## 2021-05-29 DIAGNOSIS — N184 Chronic kidney disease, stage 4 (severe): Secondary | ICD-10-CM | POA: Diagnosis not present

## 2021-05-29 DIAGNOSIS — I2510 Atherosclerotic heart disease of native coronary artery without angina pectoris: Secondary | ICD-10-CM | POA: Diagnosis not present

## 2021-05-29 DIAGNOSIS — E782 Mixed hyperlipidemia: Secondary | ICD-10-CM | POA: Diagnosis not present

## 2021-05-29 DIAGNOSIS — Z23 Encounter for immunization: Secondary | ICD-10-CM | POA: Diagnosis not present

## 2021-05-29 DIAGNOSIS — Z79899 Other long term (current) drug therapy: Secondary | ICD-10-CM | POA: Diagnosis not present

## 2021-05-29 DIAGNOSIS — N1832 Chronic kidney disease, stage 3b: Secondary | ICD-10-CM | POA: Diagnosis not present

## 2021-05-29 DIAGNOSIS — I739 Peripheral vascular disease, unspecified: Secondary | ICD-10-CM | POA: Diagnosis not present

## 2021-05-29 DIAGNOSIS — Z950 Presence of cardiac pacemaker: Secondary | ICD-10-CM | POA: Diagnosis not present

## 2021-05-29 DIAGNOSIS — E538 Deficiency of other specified B group vitamins: Secondary | ICD-10-CM | POA: Diagnosis not present

## 2021-05-29 DIAGNOSIS — I10 Essential (primary) hypertension: Secondary | ICD-10-CM | POA: Diagnosis not present

## 2021-05-29 DIAGNOSIS — Z0000 Encounter for general adult medical examination without abnormal findings: Secondary | ICD-10-CM | POA: Diagnosis not present

## 2021-06-26 ENCOUNTER — Other Ambulatory Visit: Payer: Self-pay

## 2021-06-26 ENCOUNTER — Ambulatory Visit: Payer: PPO | Admitting: Dermatology

## 2021-06-26 DIAGNOSIS — B029 Zoster without complications: Secondary | ICD-10-CM | POA: Diagnosis not present

## 2021-06-26 MED ORDER — PREDNISONE 20 MG PO TABS: 20.0000 mg | ORAL_TABLET | Freq: Every day | ORAL | 0 refills | Status: AC

## 2021-06-26 MED ORDER — VALACYCLOVIR HCL 500 MG PO TABS: 1000.0000 mg | ORAL_TABLET | Freq: Three times a day (TID) | ORAL | 0 refills | Status: AC

## 2021-06-26 NOTE — Patient Instructions (Signed)

## 2021-06-26 NOTE — Progress Notes (Signed)
° °  Follow-Up Visit   Subjective  Teresa Wood is a 88 y.o. female who presents for the following: Rash (Rash of left leg that started 4-5 days ago. It is painful and has blisters.). Accompanied by daughter who contributes to history.  The following portions of the chart were reviewed this encounter and updated as appropriate:   Tobacco   Allergies   Meds   Problems   Med Hx   Surg Hx   Fam Hx      Review of Systems:  No other skin or systemic complaints except as noted in HPI or Assessment and Plan.  Objective  Well appearing patient in no apparent distress; mood and affect are within normal limits.  A focused examination was performed including left leg. Relevant physical exam findings are noted in the Assessment and Plan.  Left Medial Thigh Blisters and bullae in a linear distribution          Assessment & Plan  Herpes zoster  -severe Left Medial Thigh  Valacyclovir 500 mg 2 po tid for 2 weeks Prednisone 20 mg 1 po qd x 7 days  Discussed gabapentin for pain - she is already taking for restless leg  valACYclovir (VALTREX) 500 MG tablet - Left Medial Thigh Take 2 tablets (1,000 mg total) by mouth 3 (three) times daily.  predniSONE (DELTASONE) 20 MG tablet - Left Medial Thigh Take 1 tablet (20 mg total) by mouth daily with breakfast.  Return for Follow up as scheduled.  I, Ashok Cordia, CMA, am acting as scribe for Sarina Ser, MD . Documentation: I have reviewed the above documentation for accuracy and completeness, and I agree with the above.  Sarina Ser, MD

## 2021-07-03 ENCOUNTER — Emergency Department
Admission: EM | Admit: 2021-07-03 | Discharge: 2021-07-04 | Disposition: A | Discharge status: Home / Self Care | Payer: PPO | Attending: Emergency Medicine | Admitting: Emergency Medicine

## 2021-07-03 ENCOUNTER — Emergency Department: Payer: PPO

## 2021-07-03 DIAGNOSIS — Z20822 Contact with and (suspected) exposure to covid-19: Secondary | ICD-10-CM | POA: Diagnosis not present

## 2021-07-03 DIAGNOSIS — K449 Diaphragmatic hernia without obstruction or gangrene: Secondary | ICD-10-CM | POA: Diagnosis not present

## 2021-07-03 DIAGNOSIS — R0689 Other abnormalities of breathing: Secondary | ICD-10-CM | POA: Diagnosis not present

## 2021-07-03 DIAGNOSIS — R5383 Other fatigue: Secondary | ICD-10-CM | POA: Insufficient documentation

## 2021-07-03 DIAGNOSIS — R000 Tachycardia, unspecified: Secondary | ICD-10-CM | POA: Diagnosis not present

## 2021-07-03 DIAGNOSIS — E538 Deficiency of other specified B group vitamins: Secondary | ICD-10-CM

## 2021-07-03 DIAGNOSIS — R531 Weakness: Secondary | ICD-10-CM | POA: Insufficient documentation

## 2021-07-03 DIAGNOSIS — I10 Essential (primary) hypertension: Secondary | ICD-10-CM | POA: Insufficient documentation

## 2021-07-03 DIAGNOSIS — R2681 Unsteadiness on feet: Secondary | ICD-10-CM | POA: Insufficient documentation

## 2021-07-03 DIAGNOSIS — R5381 Other malaise: Secondary | ICD-10-CM | POA: Diagnosis not present

## 2021-07-03 LAB — URINALYSIS, COMPLETE (UACMP) WITH MICROSCOPIC
Bacteria, UA: NONE SEEN
Bilirubin Urine: NEGATIVE
Glucose, UA: NEGATIVE mg/dL
Ketones, ur: NEGATIVE mg/dL
Leukocytes,Ua: NEGATIVE
Nitrite: NEGATIVE
Protein, ur: NEGATIVE mg/dL
Specific Gravity, Urine: 1.004 — ABNORMAL LOW (ref 1.005–1.030)
pH: 8 (ref 5.0–8.0)

## 2021-07-03 LAB — RESP PANEL BY RT-PCR (FLU A&B, COVID) ARPGX2
Influenza A by PCR: NEGATIVE
Influenza B by PCR: NEGATIVE
SARS Coronavirus 2 by RT PCR: NEGATIVE

## 2021-07-03 LAB — TROPONIN I (HIGH SENSITIVITY): Troponin I (High Sensitivity): 12 ng/L (ref <18)

## 2021-07-03 LAB — CBC
HCT: 34.4 % — ABNORMAL LOW (ref 36.0–46.0)
Hemoglobin: 11.2 g/dL — ABNORMAL LOW (ref 12.0–15.0)
MCH: 24.6 pg — ABNORMAL LOW (ref 26.0–34.0)
MCHC: 32.6 g/dL (ref 30.0–36.0)
MCV: 75.4 fL — ABNORMAL LOW (ref 80.0–100.0)
Platelets: 357 10*3/uL (ref 150–400)
RBC: 4.56 MIL/uL (ref 3.87–5.11)
RDW: 16.7 % — ABNORMAL HIGH (ref 11.5–15.5)
WBC: 10.2 10*3/uL (ref 4.0–10.5)
nRBC: 0.5 % — ABNORMAL HIGH (ref 0.0–0.2)

## 2021-07-03 LAB — COMPREHENSIVE METABOLIC PANEL
ALT: 17 U/L (ref 0–44)
AST: 26 U/L (ref 15–41)
Albumin: 4.4 g/dL (ref 3.5–5.0)
Alkaline Phosphatase: 48 U/L (ref 38–126)
Anion gap: 12 (ref 5–15)
BUN: 24 mg/dL — ABNORMAL HIGH (ref 8–23)
CO2: 23 mmol/L (ref 22–32)
Calcium: 9.6 mg/dL (ref 8.9–10.3)
Chloride: 102 mmol/L (ref 98–111)
Creatinine, Ser: 1.44 mg/dL — ABNORMAL HIGH (ref 0.44–1.00)
GFR, Estimated: 33 mL/min — ABNORMAL LOW (ref >60)
Glucose, Bld: 117 mg/dL — ABNORMAL HIGH (ref 70–99)
Potassium: 3.2 mmol/L — ABNORMAL LOW (ref 3.5–5.1)
Sodium: 137 mmol/L (ref 135–145)
Total Bilirubin: 0.9 mg/dL (ref 0.3–1.2)
Total Protein: 7.8 g/dL (ref 6.5–8.1)

## 2021-07-03 MED ORDER — GABAPENTIN 100 MG PO CAPS
100.0000 mg | ORAL_CAPSULE | Freq: Three times a day (TID) | ORAL | Status: DC
  Administered 2021-07-03 – 2021-07-04 (×2): 100 mg via ORAL
  Filled 2021-07-03 (×2): qty 1

## 2021-07-03 MED ORDER — HYDROCHLOROTHIAZIDE 12.5 MG PO TABS
12.5000 mg | ORAL_TABLET | Freq: Every day | ORAL | Status: DC
  Administered 2021-07-04: 12.5 mg via ORAL
  Filled 2021-07-03: qty 1

## 2021-07-03 MED ORDER — BENAZEPRIL HCL 20 MG PO TABS
20.0000 mg | ORAL_TABLET | Freq: Every day | ORAL | Status: DC
Start: 2021-07-04 — End: 2021-07-04
  Filled 2021-07-03: qty 1

## 2021-07-03 MED ORDER — AMLODIPINE BESYLATE 5 MG PO TABS
5.0000 mg | ORAL_TABLET | Freq: Every morning | ORAL | Status: DC
  Administered 2021-07-04: 5 mg via ORAL
  Filled 2021-07-03: qty 1

## 2021-07-03 MED ORDER — PANTOPRAZOLE SODIUM 40 MG PO TBEC
40.0000 mg | DELAYED_RELEASE_TABLET | Freq: Every morning | ORAL | Status: DC
  Administered 2021-07-04: 40 mg via ORAL
  Filled 2021-07-03: qty 1

## 2021-07-03 MED ORDER — BENAZEPRIL-HYDROCHLOROTHIAZIDE 20-12.5 MG PO TABS
1.0000 | ORAL_TABLET | Freq: Every morning | ORAL | Status: DC
Start: 2021-07-04 — End: 2021-07-03

## 2021-07-03 MED ORDER — TRAZODONE HCL 100 MG PO TABS
100.0000 mg | ORAL_TABLET | Freq: Every day | ORAL | Status: DC
  Administered 2021-07-03: 100 mg via ORAL
  Filled 2021-07-03: qty 1

## 2021-07-03 MED ORDER — CLOPIDOGREL BISULFATE 75 MG PO TABS
75.0000 mg | ORAL_TABLET | Freq: Every morning | ORAL | Status: DC
  Administered 2021-07-04: 75 mg via ORAL
  Filled 2021-07-03: qty 1

## 2021-07-03 MED ORDER — SODIUM CHLORIDE 0.9 % IV BOLUS
1000.0000 mL | Freq: Once | INTRAVENOUS | Status: AC
  Administered 2021-07-03: 1000 mL via INTRAVENOUS

## 2021-07-03 MED ORDER — PRAVASTATIN SODIUM 20 MG PO TABS
40.0000 mg | ORAL_TABLET | Freq: Every day | ORAL | Status: DC
  Administered 2021-07-03 – 2021-07-04 (×2): 40 mg via ORAL
  Filled 2021-07-03 (×2): qty 2

## 2021-07-03 NOTE — ED Notes (Signed)
Pt placed on hospital bed. Pt on monitor and call light within reach. Daughter at bedside.

## 2021-07-03 NOTE — ED Provider Notes (Signed)
[facility] Provider Note    Event Date/Time   First MD Initiated Contact with Patient 07/03/21 1516     (approximate)  History   Chief Complaint: Fatigue (Weak, lethargic, axox4, pt lives alone at home , pts niece called ems )  HPI  Teresa Wood is a 88 y.o. female with a past medical history of hypertension, quite independent, lives alone per patient who presents to the emergency department for generalized weakness.  Patient states she has been feeling very weak and tired over the past several days.  Denies any specific complaints.  Specifically denies any chest pain, abdominal pain, nausea vomiting or diarrhea, cough congestion, dysuria.  Patient's niece was concerned so called EMS to bring the patient to the emergency department.  Physical Exam   Triage Vital Signs: ED Triage Vitals  Enc Vitals Group     BP 07/03/21 1453 (!) 176/87     Pulse Rate 07/03/21 1453 (!) 102     Resp 07/03/21 1453 17     Temp 07/03/21 1453 98.1 F (36.7 C)     Temp Source 07/03/21 1453 Oral     SpO2 07/03/21 1453 100 %     Weight 07/03/21 1455 130 lb 1.1 oz (59 kg)     Height 07/03/21 1455 5\' 5"  (1.651 m)     Head Circumference --      Peak Flow --      Pain Score --      Pain Loc --      Pain Edu? --      Excl. in [place]? --     Most recent vital signs: Vitals:   07/03/21 1453  BP: (!) 176/87  Pulse: (!) 102  Resp: 17  Temp: 98.1 F (36.7 C)  SpO2: 100%    General: Awake, no distress.  CV:  Good peripheral perfusion.  Regular rate and rhythm  Resp:  Normal effort.  Equal breath sounds bilaterally.  Abd:  No distention.  Soft, nontender.  No rebound or guarding.    ED Results / Procedures / Treatments   EKG  EKG viewed and interpreted by myself shows sinus tachycardia 102 bpm with a narrow QRS, normal axis, slight PR prolongation otherwise normal intervals with nonspecific but no concerning ST changes.  RADIOLOGY  I have personally reviewed the  chest x-ray images.  No acute abnormality seen on my evaluation. Chest x-ray read by radiology as as mild left basilar atelectasis no other acute abnormality.   MEDICATIONS ORDERED IN ED: Medications  sodium chloride 0.9 % bolus 1,000 mL (has no administration in time range)     IMPRESSION / MDM / ASSESSMENT AND PLAN / ED COURSE  I reviewed the triage vital signs and the nursing notes.  Patient presents to the emergency department for generalized fatigue and weakness.  Patient has no specific complaints.  Differential is quite broad but would include infectious etiology such as urinary tract infection COVID or flu, metabolic or electrolyte abnormality such as renal insufficiency, dehydration, ACS.  We will check labs, EKG and a chest x-ray as a precaution.  We will IV hydrate while awaiting results.  Patient agreeable to plan of care.  Patient's work-up in the emergency department is overall nonrevealing.  CBC is largely normal.  Chemistry largely at baseline including baseline renal function.  Reassuringly troponin is negative.  Patient's COVID and influenza are negative.  Patient's urinalysis has resulted normal/negative as well.  Patient's family member is here now and  I have discussed the reassuring work-up with the family member.  Nurse states he did take 2 people to move the patient from the bed to the toilet with near full assistance.  Patient does appear too weak to go home.  The family member states this has been rather progressive but she does not believe the patient is safe to go home alone and stay alone.  We will obtain PT evaluation, social work evaluation.  We will continue to closely monitor in the emergency department while awaiting further evaluation.  I believe the patient would benefit from placement into a nursing facility.  FINAL CLINICAL IMPRESSION(S) / ED DIAGNOSES   Weakness Fatigue   Note:  This document was prepared using Dragon voice recognition software and may  include unintentional dictation errors.   Harvest Dark, MD 07/03/21 1736

## 2021-07-03 NOTE — ED Notes (Signed)
Pt given dinner tray.

## 2021-07-03 NOTE — ED Triage Notes (Signed)
Pt weak and lethargic, lives at home [place], 141 blood sugar

## 2021-07-04 MED ORDER — CYANOCOBALAMIN 1000 MCG/ML IJ SOLN
1000.0000 ug | Freq: Once | INTRAMUSCULAR | Status: AC
  Administered 2021-07-04: 1000 ug via INTRAMUSCULAR
  Filled 2021-07-04: qty 1

## 2021-07-04 NOTE — Discharge Instructions (Addendum)
Our social worker has arranged for home health aide and physical therapy at home for you.  Please coordinate with them when they contact you.  You were given your monthly B12 injection today while at the emergency department.

## 2021-07-04 NOTE — TOC Progression Note (Signed)
Transition of Care [facility]) - Progression Note    Patient Details  Name: Teresa Wood MRN: [id number] Date of Birth: 12/06/1923  Transition of Care [facility] CM/SW Contact  Shelbie Hutching, RN Phone Number: 07/04/2021, 3:37 PM  Clinical Narrative:    Patient does not need a home health care aide at home.  Unable to discontinue the order.  MD aware.    Expected Discharge Plan: [place] Barriers to Discharge: Barriers Resolved  Expected Discharge Plan and Services Expected Discharge Plan: [place]   Discharge Planning Services: CM Consult Post Acute Care Choice: [place] arrangements for the past 2 months: Single Family Home                 DME Arranged: N/A DME Agency: NA       HH Arranged: PT [place] Agency: [place] ([place]) Date [place]: 07/04/21 Time [place]: 0930 Representative spoke with at [place]: [place] ([place]) Interventions    Readmission Risk Interventions No flowsheet data found.

## 2021-07-04 NOTE — ED Notes (Signed)
Pt resting with eyes closed.

## 2021-07-04 NOTE — Evaluation (Signed)
Physical Therapy Evaluation Patient Details Name: Teresa Wood MRN: [id number] DOB: 10/07/1923 Today's Date: 07/04/2021  History of Present Illness  Teresa Wood is a 63yoF who comes to [facility] ED on 2/23 weak and lethargic. Covid and Flu tests negative. Pt live alone, has a friend that provides transportation, pt uses QC for AMB in and out of home, no falls in past 12 months.  Clinical Impression  Pt admitted c above Dx. Pt shows functional limitations due to the deficits listed below (see "PT Problem List"). Patient agreeable to PT evaluation. PLOF and home setup obtained. DTR in room to corroborate detail. Pt has clear empirical signs of falls risk from a gait speed and balance screening standpoint, however she has had no recent decline in mobility/balance and no falls in past 12 months, perhaps a testament to pt's excellent safety awareness and prudence with activity. DTR is agreeable that HHPT would help maximize mobility potential as would some additional household support for a few days post DC until confidence has returned. Pt able to perform bed mobility, transfers, and AMB with baseline effort and quality. Patient's assessment reveals only baseline limitations in strength, balance, and activity tolerance. At baseline patient is able to perform ADL with modified independence, IADL with supervision assistance to [place]. Patient will benefit from skilled PT intervention to maximize independence and safety in mobility required for basic ADL performance at discharge.          Recommendations for follow up therapy are one component of a multi-disciplinary discharge planning process, led by the attending physician.  Recommendations may be updated based on patient status, additional functional criteria and insurance authorization.  Follow Up Recommendations Home health PT    Assistance Recommended at Discharge Intermittent Supervision/Assistance  Patient can return home with the following   Assistance with cooking/housework;A little help with bathing/dressing/bathroom;Assist for transportation    Equipment Recommendations None recommended by PT  Recommendations for Other Services       Functional Status Assessment Patient has not had a recent decline in their functional status     Precautions / Restrictions Precautions Precautions: None Restrictions Weight Bearing Restrictions: No      Mobility  Bed Mobility Overal bed mobility: Modified Independent                  Transfers Overall transfer level: Needs assistance Equipment used: 1 person hand held assist Transfers: Sit to/from Stand Sit to Stand: Min assist           General transfer comment: no QC available for session in ED    Ambulation/Gait Ambulation/Gait assistance: Min assist Gait Distance (Feet): 40 Feet Assistive device: 1 person hand held assist Gait Pattern/deviations: Step-to pattern       General Gait Details: slow, segmented pattern, appears weak with labored Rt foot clearance and scuff. Pt and DTR both attest to baseline gait quality without much concern.  Stairs            Wheelchair Mobility    Modified Rankin (Stroke Patients Only)       Balance                                             Pertinent Vitals/Pain Pain Assessment Pain Assessment: No/denies pain    Home Living Family/patient expects to be discharged to:: Private residence Living Arrangements: Alone   Type of Home:  House Home Access: Stairs to enter   [employer] of Steps: 1 partial step in garage   Home Layout: One level Home Equipment: BSC/3in1;Cane - quad Additional Comments: remote hip fracutre ~ 20 years ago.    Prior Function                 ADLs Comments: Pt does not drive, has a lady that takes her to grocery, to hair place, also helps with light housework.     Hand Dominance        Extremity/Trunk Assessment                 Communication      Cognition Arousal/Alertness: Awake/alert Behavior During Therapy: WFL for tasks assessed/performed Overall Cognitive Status: Within Functional Limits for tasks assessed                                          General Comments      Exercises     Assessment/Plan    PT Assessment Patient needs continued PT services  PT Problem List Decreased strength;Decreased activity tolerance;Decreased balance;Decreased mobility       PT Treatment Interventions DME instruction;Balance training;Gait training;Stair training;Functional mobility training;Therapeutic activities;Therapeutic exercise;Patient/family education    PT Goals (Current goals can be found in the Care Plan section)  Acute Rehab PT Goals Patient Stated Goal: Return to home, remain in home as long as possible PT Goal Formulation: With patient Time For Goal Achievement: 07/18/21 Potential to Achieve Goals: Good    Frequency Min 2X/week     Co-evaluation               AM-PAC PT "6 Clicks" Mobility  Outcome Measure Help needed turning from your back to your side while in a flat bed without using bedrails?: None Help needed moving from lying on your back to sitting on the side of a flat bed without using bedrails?: None Help needed moving to and from a bed to a chair (including a wheelchair)?: A Little Help needed standing up from a chair using your arms (e.g., wheelchair or bedside chair)?: A Little Help needed to walk in hospital room?: A Little Help needed climbing 3-5 steps with a railing? : A Little 6 Click Score: 20    End of Session Equipment Utilized During Treatment: Gait belt Activity Tolerance: Patient tolerated treatment well;No increased pain Patient left: in chair;with family/visitor present   PT Visit Diagnosis: Unsteadiness on feet (R26.81);Difficulty in walking, not elsewhere classified (R26.2)    Time: 8242-3536 PT Time Calculation (min) (ACUTE ONLY): 31  min   Charges:   PT Evaluation $PT Eval Low Complexity: 1 Low         9:50 AM, 07/04/21 Etta Grandchild, PT, DPT Physical Therapist - [facility]  [phone] ([place])    Clois Treanor C 07/04/2021, 9:42 AM

## 2021-07-04 NOTE — ED Provider Notes (Signed)
Today's Vitals   07/04/21 0045 07/04/21 0100 07/04/21 0130 07/04/21 0200  BP: 119/63 (!) 123/55 (!) 112/50 (!) 139/47  Pulse: 63 (!) 58 61 61  Resp: 19 13 14 14   Temp:      TempSrc:      SpO2: 98% 98% 97% 97%  Weight:      Height:       Body mass index is 21.64 kg/m.   Patient resting comfortably.  No acute events overnight.  Awaiting social work disposition.   Shaelin Lalley, Delice Bison, DO 07/04/21 [phone]

## 2021-07-04 NOTE — TOC Progression Note (Signed)
Transition of Care [facility]) - Progression Note    Patient Details  Name: Teresa Wood MRN: [id number] Date of Birth: 1923/12/13  Transition of Care [facility] CM/SW Contact  Shelbie Hutching, RN Phone Number: 07/04/2021, 11:26 AM  Clinical Narrative:    [facility] will be able to see patient on 2/27.  They do not currently have nursing aid availability.     Expected Discharge Plan: [place] Barriers to Discharge: Barriers Resolved  Expected Discharge Plan and Services Expected Discharge Plan: [place]   Discharge Planning Services: CM Consult Post Acute Care Choice: [place] arrangements for the past 2 months: Single Family Home                 DME Arranged: N/A DME Agency: NA       HH Arranged: PT [place] Agency: [place] ([place]) Date [place]: 07/04/21 Time [place]: 0930 Representative spoke with at [place]: [place] ([place]) Interventions    Readmission Risk Interventions No flowsheet data found.

## 2021-07-04 NOTE — TOC Initial Note (Signed)
Transition of Care [facility]) - Initial/Assessment Note    Patient Details  Name: Teresa Wood MRN: [id number] Date of Birth: July 17, 1923  Transition of Care [facility]) CM/SW Contact:    Teresa Hutching, RN Phone Number: 07/04/2021, 11:13 AM  Clinical Narrative:                 Patient came into the emergency room for weakness, no indication for patient to be admitted to the hospital.  PT is recommending home health and patient and daughter agree with recommendation. RNCM met with patient and daughter at the bedside.  Patient is from home where she lives alone and walks with a 4 point cane.  Patient does not drive, her daughter provides transportation.  PCP is Dr. Doy Wood, she gets prescriptions from [facility].   The daughter will be staying with patient for a few days to make sure she is okay at home, they will evaluate on how much care they need to get for her. Teresa Wood with Teresa Wood has accepted referral for HHPT.  Daughter to transport patient home.    Expected Discharge Plan: [place] Barriers to Discharge: Barriers Resolved   Patient Goals and CMS Choice Patient states their goals for this hospitalization and ongoing recovery are:: Patient and family will discharge home with home health PT CMS Medicare.gov Compare Post Acute Care list provided to:: Patient Choice offered to / list presented to : Patient, Adult Children  Expected Discharge Plan and Services Expected Discharge Plan: [place]   Discharge Planning Services: CM Consult Post Acute Care Choice: [place] arrangements for the past 2 months: Single Family Home                 DME Arranged: N/A DME Agency: NA       HH Arranged: PT [place] Agency: [place] ([place]) Date HH Agency Contacted: 07/04/21 Time HH Agency Contacted: 0930 Representative spoke with at [place]: Teresa Wood  Prior Living Arrangements/Services Living arrangements for the past 2  months: [place] with:: Self Patient language and need for interpreter reviewed:: Yes Do you feel safe going back to the place where you live?: Yes      Need for Family Participation in Patient Care: Yes (Comment) Care giver support system in place?: Yes (comment) (daughter) Current home services: DME (cane) Criminal Activity/Legal Involvement Pertinent to Current Situation/Hospitalization: No - Comment as needed  Activities of Daily Living      Permission Sought/Granted Permission sought to share information with : Case Manager, Family Supports, Other (comment) Permission granted to share information with : Yes, Verbal Permission Granted  Share Information with NAME: Teresa Wood  Permission granted to share info w AGENCY: Home Health agencies  Permission granted to share info w Relationship: daughter  Permission granted to share info w Contact Information: [phone]  Emotional Assessment Appearance:: Appears stated age Attitude/Demeanor/Rapport: Engaged Affect (typically observed): Accepting Orientation: : Oriented to Self, Oriented to Place, Oriented to  Time, Oriented to Situation Alcohol / Substance Use: Not Applicable Psych Involvement: No (comment)  Admission diagnosis:  Lethargic, EMS Patient Active Problem List   Diagnosis Date Noted   Anemia, unspecified 04/15/2018   Benign hypertension 04/15/2018   Bradycardia 04/15/2018   CAD (coronary artery disease) 04/15/2018   Fibrocystic breast disease 04/15/2018   GI bleed 04/15/2018   History of degenerative disc disease 04/15/2018   Hyperlipidemia, unspecified 04/15/2018   PVD (peripheral vascular disease) (  [place]) 04/15/2018   Venous stasis 04/15/2018   Pacemaker 03/25/2017   Sick sinus syndrome ([place]) 02/09/2017   Syncope 02/04/2017   PCP:  Idelle Crouch, MD Pharmacy:   [place], [place] - [place] [address] Phone: [phone] Fax:  [phone]     Social Determinants of Health (SDOH) Interventions    Readmission Risk Interventions No flowsheet data found.

## 2021-07-05 ENCOUNTER — Encounter: Payer: Self-pay | Admitting: Dermatology

## 2021-07-09 DIAGNOSIS — E860 Dehydration: Secondary | ICD-10-CM | POA: Diagnosis not present

## 2021-07-09 DIAGNOSIS — Z7902 Long term (current) use of antithrombotics/antiplatelets: Secondary | ICD-10-CM | POA: Diagnosis not present

## 2021-07-09 DIAGNOSIS — Z9181 History of falling: Secondary | ICD-10-CM | POA: Diagnosis not present

## 2021-07-09 DIAGNOSIS — I10 Essential (primary) hypertension: Secondary | ICD-10-CM | POA: Diagnosis not present

## 2021-07-09 DIAGNOSIS — R531 Weakness: Secondary | ICD-10-CM | POA: Diagnosis not present

## 2021-07-11 DIAGNOSIS — I2510 Atherosclerotic heart disease of native coronary artery without angina pectoris: Secondary | ICD-10-CM | POA: Diagnosis not present

## 2021-07-11 DIAGNOSIS — K449 Diaphragmatic hernia without obstruction or gangrene: Secondary | ICD-10-CM | POA: Diagnosis not present

## 2021-07-11 DIAGNOSIS — E538 Deficiency of other specified B group vitamins: Secondary | ICD-10-CM | POA: Diagnosis not present

## 2021-07-11 DIAGNOSIS — N1832 Chronic kidney disease, stage 3b: Secondary | ICD-10-CM | POA: Diagnosis not present

## 2021-07-11 DIAGNOSIS — Z79899 Other long term (current) drug therapy: Secondary | ICD-10-CM | POA: Diagnosis not present

## 2021-07-11 DIAGNOSIS — I10 Essential (primary) hypertension: Secondary | ICD-10-CM | POA: Diagnosis not present

## 2021-07-11 DIAGNOSIS — E7800 Pure hypercholesterolemia, unspecified: Secondary | ICD-10-CM | POA: Diagnosis not present

## 2021-07-11 DIAGNOSIS — I5022 Chronic systolic (congestive) heart failure: Secondary | ICD-10-CM | POA: Diagnosis not present

## 2021-07-24 ENCOUNTER — Telehealth: Payer: Self-pay | Admitting: Student

## 2021-07-24 NOTE — Telephone Encounter (Signed)
Spoke with patient's daughter Eustaquio Maize, regarding the Palliative referral/services and all questions were answered and she was in agreement with scheduling visit.  I have scheduled an In-home Consult for 08/13/21 @ 2:30 PM - per daughter's request, she wanted to be there for the visit and she lives out of town. ?

## 2021-07-28 DIAGNOSIS — R531 Weakness: Secondary | ICD-10-CM | POA: Diagnosis not present

## 2021-07-28 DIAGNOSIS — E860 Dehydration: Secondary | ICD-10-CM | POA: Diagnosis not present

## 2021-07-28 DIAGNOSIS — Z7902 Long term (current) use of antithrombotics/antiplatelets: Secondary | ICD-10-CM | POA: Diagnosis not present

## 2021-07-28 DIAGNOSIS — Z9181 History of falling: Secondary | ICD-10-CM | POA: Diagnosis not present

## 2021-07-28 DIAGNOSIS — I10 Essential (primary) hypertension: Secondary | ICD-10-CM | POA: Diagnosis not present

## 2021-08-06 DIAGNOSIS — E538 Deficiency of other specified B group vitamins: Secondary | ICD-10-CM | POA: Diagnosis not present

## 2021-08-06 DIAGNOSIS — I2510 Atherosclerotic heart disease of native coronary artery without angina pectoris: Secondary | ICD-10-CM | POA: Diagnosis not present

## 2021-08-06 DIAGNOSIS — R7309 Other abnormal glucose: Secondary | ICD-10-CM | POA: Diagnosis not present

## 2021-08-06 DIAGNOSIS — E7800 Pure hypercholesterolemia, unspecified: Secondary | ICD-10-CM | POA: Diagnosis not present

## 2021-08-06 DIAGNOSIS — I10 Essential (primary) hypertension: Secondary | ICD-10-CM | POA: Diagnosis not present

## 2021-08-06 DIAGNOSIS — Z79899 Other long term (current) drug therapy: Secondary | ICD-10-CM | POA: Diagnosis not present

## 2021-08-12 DIAGNOSIS — E860 Dehydration: Secondary | ICD-10-CM | POA: Diagnosis not present

## 2021-08-12 DIAGNOSIS — I10 Essential (primary) hypertension: Secondary | ICD-10-CM | POA: Diagnosis not present

## 2021-08-12 DIAGNOSIS — Z7902 Long term (current) use of antithrombotics/antiplatelets: Secondary | ICD-10-CM | POA: Diagnosis not present

## 2021-08-12 DIAGNOSIS — R531 Weakness: Secondary | ICD-10-CM | POA: Diagnosis not present

## 2021-08-12 DIAGNOSIS — Z9181 History of falling: Secondary | ICD-10-CM | POA: Diagnosis not present

## 2021-08-13 ENCOUNTER — Other Ambulatory Visit: Payer: PPO | Admitting: Student

## 2021-08-13 DIAGNOSIS — Z515 Encounter for palliative care: Secondary | ICD-10-CM

## 2021-08-13 DIAGNOSIS — I2510 Atherosclerotic heart disease of native coronary artery without angina pectoris: Secondary | ICD-10-CM

## 2021-08-13 DIAGNOSIS — R531 Weakness: Secondary | ICD-10-CM | POA: Diagnosis not present

## 2021-08-13 DIAGNOSIS — G3184 Mild cognitive impairment, so stated: Secondary | ICD-10-CM | POA: Diagnosis not present

## 2021-08-13 NOTE — Progress Notes (Signed)
? ? ?Manufacturing engineer ?Community Palliative Care Consult Note ?Telephone: [phone]  ?Fax: [phone]  ? ?Date of encounter: 08/13/21 ?2:42 PM ?PATIENT NAME: Teresa Wood ?[address] ?[place] [address]   ?[phone] (home)  ?DOB: [birth year] ?MRN: [id number] ?PRIMARY CARE PROVIDER:    ?Idelle Crouch, MD,  ?[place] ?[place] [address] ?[phone] ? ?REFERRING PROVIDER:   ?Idelle Crouch, MD ?[facility] ?[place],  [place] [address] ?[phone] ? ?RESPONSIBLE PARTY:    ?Contact Information   ? ? Name Relation Home Work Mobile  ? [name] Daughter [phone]    ? Daye,Robert  [phone]    ? Jerrell [place] [phone]    ? ?  ? ? ? ?I met face to face with patient and family in the home. Palliative Care was asked to follow this patient by consultation request of  [place], Leonie Douglas, MD to address advance care planning and complex medical decision making. This is the initial visit.  ? ? ?                                 ASSESSMENT AND PLAN / RECOMMENDATIONS:  ? ?Advance Care Planning/Goals of Care: Goals include to maximize quality of life and symptom management. Patient/health care surrogate gave his/her permission to discuss.Our advance care planning conversation included a discussion about:    ?The value and importance of advance care planning  ?Experiences with loved ones who have been seriously ill or have died  ?Exploration of personal, cultural or spiritual beliefs that might influence medical decisions  ?Exploration of goals of care in the event of a sudden injury or illness  ?Has a Living Will. ?CODE STATUS: DNR ? ? ?Education on Palliative Medicine vs. Hospice services. Goal is to stay at home and maintain her independence. Would go to hospital if needed. ? ?Symptom Management/Plan: ? ?Generalized weakness-continue HH therapy as directed. Use cane for ambulation. Monitor for falls/safety. ? ?Mild  cognitive impairment-patient with increased forgetfulness. Reorient and redirect as needed. Continue medication pill box, life alert in the home. Daily caregivers to help assist with adl's, cueing.  ? ?CAD- continue pravastatin, Plavix, amlodipine as directed. Follow up with cardiology as scheduled.  ? ?Follow up Palliative Care Visit: Palliative care will continue to follow for complex medical decision making, advance care planning, and clarification of goals. Return in 6 weeks or prn. ? ?This visit was coded based on medical decision making (MDM). ? ?PPS: 50% ? ?HOSPICE ELIGIBILITY/DIAGNOSIS: TBD ? ?Chief Complaint: Palliative Medicine initial consult.  ? ?HISTORY OF PRESENT ILLNESS:  Teresa Wood is a 88 y.o. year old female  with CAD without angina pectoris, hypertension, hyperlipidemia, peripheral vascular disease, venous stasis, degenerative disc disease, syncope, aortic atherosclerosis, anemia, B12 deficiency, history of TIA. ? ?Resides at home. Caregivers 6 days a week. Daughter is concerned with worsening memory, forgetfulness. Does a medication box. Has a life alert, no falls. Uses quad cane for ambulation. [facility]. Denies pain at present; occasional pain to left leg from shingles but expresses it is currently managed well. No shortness of breath, nausea, constipation. Good appetite endorsed; weight is stable. She is sleeping well at night.  ? ?History obtained from review of EMR, discussion with primary team, and interview with family, facility staff/caregiver and/or Teresa Wood.  ?I reviewed available labs, medications, imaging, studies and related  documents from the EMR.  Records reviewed and summarized above.  ? ?ROS ? ?General: NAD ?EYES: denies vision changes ?ENMT: denies dysphagia ?Cardiovascular: denies chest pain, denies DOE ?Pulmonary: denies cough, denies increased SOB ?Abdomen: endorses good appetite, denies constipation, endorses continence of  bowel ?GU: denies dysuria ?MSK: + weakness,  no falls reported ?Skin: denies rashes or wounds ?Neurological: denies pain, denies insomnia ?Psych: Endorses positive mood ?Heme/lymph/immuno: denies bruises, abnormal bleeding ? ?Physical Exam: ? ?Pulse 72, resp 16, b/p 130/68, sats 97% on room air.  ?Constitutional: NAD ?General: frail appearing ?EYES: anicteric sclera, lids intact, no discharge  ?ENMT: intact hearing, oral mucous membranes moist, dentition intact ?CV: S1S2, RRR, mild pedal edema ?Pulmonary: LCTA, no increased work of breathing, no cough, room air ?Abdomen: normo-active BS + 4 quadrants, soft and non tender, no ascites ?GU: deferred ?MSK: no sarcopenia, moves all extremities, ambulatory with quad cane ?Skin: warm and dry, no rashes or wounds on visible skin ?Neuro: generalized weakness, A & O x 3 ?Psych: non-anxious affect ?Hem/lymph/immuno: no widespread bruising ?CURRENT PROBLEM LIST:  ?Patient Active Problem List  ? Diagnosis Date Noted  ? Anemia, unspecified 04/15/2018  ? Benign hypertension 04/15/2018  ? Bradycardia 04/15/2018  ? CAD (coronary artery disease) 04/15/2018  ? Fibrocystic breast disease 04/15/2018  ? GI bleed 04/15/2018  ? History of degenerative disc disease 04/15/2018  ? Hyperlipidemia, unspecified 04/15/2018  ? PVD (peripheral vascular disease) ([place]) 04/15/2018  ? Venous stasis 04/15/2018  ? Pacemaker 03/25/2017  ? Sick sinus syndrome ([place]) 02/09/2017  ? Syncope 02/04/2017  ? ?PAST MEDICAL HISTORY:  ?Active Ambulatory Problems  ?  Diagnosis Date Noted  ? Syncope 02/04/2017  ? Sick sinus syndrome ([place]) 02/09/2017  ? Anemia, unspecified 04/15/2018  ? Benign hypertension 04/15/2018  ? Bradycardia 04/15/2018  ? CAD (coronary artery disease) 04/15/2018  ? Fibrocystic breast disease 04/15/2018  ? GI bleed 04/15/2018  ? History of degenerative disc disease 04/15/2018  ? Hyperlipidemia, unspecified 04/15/2018  ? PVD (peripheral vascular disease) ([place]) 04/15/2018  ? Venous stasis 04/15/2018   ? Pacemaker 03/25/2017  ? ?Resolved Ambulatory Problems  ?  Diagnosis Date Noted  ? No Resolved Ambulatory Problems  ? ?Past Medical History:  ?Diagnosis Date  ? Elevated cholesterol   ? GERD (gastroesophageal reflux disease)   ? Hypertension   ? Squamous cell carcinoma of skin 02/20/2021  ? TIA (transient ischemic attack)   ? ?SOCIAL HX:  ?Social History  ? ?Tobacco Use  ? Smoking status: Never  ? Smokeless tobacco: Never  ?Substance Use Topics  ? Alcohol use: No  ? ?FAMILY HX: No family history on file.   ? ?ALLERGIES: No Known Allergies   ?PERTINENT MEDICATIONS:  ?Outpatient Encounter Medications as of 08/13/2021  ?Medication Sig  ? amLODipine (NORVASC) 5 MG tablet Take 5 mg by mouth in the morning.  ? benazepril-hydrochlorthiazide (LOTENSIN HCT) 20-12.5 MG tablet Take 1 tablet by mouth in the morning.  ? Calcium Carbonate-Vit D-Min (CALCIUM 600+D3 PLUS MINERALS) 600-800 MG-UNIT CHEW Chew 1 tablet by mouth daily.  ? clopidogrel (PLAVIX) 75 MG tablet Take 75 mg by mouth in the morning.  ? gabapentin (NEURONTIN) 100 MG capsule Take 100 mg by mouth at bedtime.  ? mometasone (ELOCON) 0.1 % cream Apply 1 application topically daily as needed (Rash). Apply to itchy eczema skin once or twice a day as needed  ? pantoprazole (PROTONIX) 40 MG tablet Take 40 mg by mouth in the morning.  ? pravastatin (PRAVACHOL) 40 MG tablet Take 40  mg by mouth daily.  ? predniSONE (DELTASONE) 20 MG tablet Take 1 tablet (20 mg total) by mouth daily with breakfast. (Patient not taking: Reported on 07/03/2021)  ? valACYclovir (VALTREX) 500 MG tablet Take 2 tablets (1,000 mg total) by mouth 3 (three) times daily.  ? ?No facility-administered encounter medications on file as of 08/13/2021.  ? ?Thank you for the opportunity to participate in the care of Teresa Wood.  The palliative care team will continue to follow. Please call our office at [phone] if we can be of additional assistance.  ? ?Ezekiel Slocumb, NP  ? ?COVID-19 PATIENT SCREENING  TOOL ?Asked and negative response unless otherwise noted: ? ?Have you had symptoms of covid, tested positive or been in contact with someone with symptoms/positive test in the past 5-10 days? No ?

## 2021-08-21 DIAGNOSIS — R7309 Other abnormal glucose: Secondary | ICD-10-CM | POA: Diagnosis not present

## 2021-08-21 DIAGNOSIS — Z79899 Other long term (current) drug therapy: Secondary | ICD-10-CM | POA: Diagnosis not present

## 2021-08-26 DIAGNOSIS — I10 Essential (primary) hypertension: Secondary | ICD-10-CM | POA: Diagnosis not present

## 2021-08-26 DIAGNOSIS — D513 Other dietary vitamin B12 deficiency anemia: Secondary | ICD-10-CM | POA: Diagnosis not present

## 2021-08-27 ENCOUNTER — Ambulatory Visit: Payer: PPO | Admitting: Dermatology

## 2021-08-27 DIAGNOSIS — B0229 Other postherpetic nervous system involvement: Secondary | ICD-10-CM

## 2021-08-27 DIAGNOSIS — L810 Postinflammatory hyperpigmentation: Secondary | ICD-10-CM

## 2021-08-27 NOTE — Progress Notes (Signed)
? ?  Follow-Up Visit ?  ?Subjective  ?Teresa Wood is a 88 y.o. female who presents for the following: Follow-up (2 months f/u on shingles on the left leg, pt report leg is better just sometimes painful). ? ?The following portions of the chart were reviewed this encounter and updated as appropriate:  ? Tobacco  Allergies  Meds  Problems  Med Hx  Surg Hx  Fam Hx   ?  ?Review of Systems:  No other skin or systemic complaints except as noted in HPI or Assessment and Plan. ? ?Objective  ?Well appearing patient in no apparent distress; mood and affect are within normal limits. ? ?A focused examination was performed including left leg. Relevant physical exam findings are noted in the Assessment and Plan. ? ?left leg ?No rash  ? ?Left Lower Leg - Anterior ?Hyperpigmented macules ? ? ?Assessment & Plan  ?Postherpetic neuralgia ?left leg ?Postherpetic neuralgia-post shingles  ?Patient has finished valacyclovir treatment and blisters have resolved ?Discussed treatment options Voltaren cream or skin medicinals itch cream  ?Cont Gabapentin as directed  ?Can use Mometasone cream prn  ? ?Post-inflammatory hyperpigmentation ?Left Lower Leg - Anterior ?Secondary to shingles ?No treatment need, color will fade over time.  ?Reassured ? ?Return if symptoms worsen or fail to improve. ? ?I, Marye Round, CMA, am acting as scribe for Sarina Ser, MD .  ?Documentation: I have reviewed the above documentation for accuracy and completeness, and I agree with the above. ? ?Sarina Ser, MD ? ?

## 2021-08-27 NOTE — Patient Instructions (Signed)

## 2021-09-03 DIAGNOSIS — R531 Weakness: Secondary | ICD-10-CM | POA: Diagnosis not present

## 2021-09-03 DIAGNOSIS — I10 Essential (primary) hypertension: Secondary | ICD-10-CM | POA: Diagnosis not present

## 2021-09-03 DIAGNOSIS — Z7902 Long term (current) use of antithrombotics/antiplatelets: Secondary | ICD-10-CM | POA: Diagnosis not present

## 2021-09-03 DIAGNOSIS — Z9181 History of falling: Secondary | ICD-10-CM | POA: Diagnosis not present

## 2021-09-03 DIAGNOSIS — E860 Dehydration: Secondary | ICD-10-CM | POA: Diagnosis not present

## 2021-09-05 ENCOUNTER — Encounter: Payer: Self-pay | Admitting: Dermatology

## 2021-09-08 DIAGNOSIS — E538 Deficiency of other specified B group vitamins: Secondary | ICD-10-CM | POA: Diagnosis not present

## 2021-09-09 DIAGNOSIS — R413 Other amnesia: Secondary | ICD-10-CM | POA: Diagnosis not present

## 2021-09-09 DIAGNOSIS — E785 Hyperlipidemia, unspecified: Secondary | ICD-10-CM | POA: Diagnosis not present

## 2021-09-09 DIAGNOSIS — I10 Essential (primary) hypertension: Secondary | ICD-10-CM | POA: Diagnosis not present

## 2021-09-25 ENCOUNTER — Other Ambulatory Visit: Payer: PPO | Admitting: Student

## 2021-09-25 DIAGNOSIS — I2510 Atherosclerotic heart disease of native coronary artery without angina pectoris: Secondary | ICD-10-CM

## 2021-09-25 DIAGNOSIS — Z515 Encounter for palliative care: Secondary | ICD-10-CM | POA: Diagnosis not present

## 2021-09-25 DIAGNOSIS — G3184 Mild cognitive impairment, so stated: Secondary | ICD-10-CM

## 2021-09-25 DIAGNOSIS — R531 Weakness: Secondary | ICD-10-CM | POA: Diagnosis not present

## 2021-09-25 NOTE — Progress Notes (Signed)
[facility] Consult Note Telephone: [phone]  Fax: [phone]    Date of encounter: 09/25/21 11:14 AM PATIENT NAME: Teresa Wood [address] [place] [address]   [phone] (home)  DOB: 1923-10-24 MRN: [id number] PRIMARY CARE PROVIDER:    Idelle Crouch, MD,  [address] [place] [address] [phone]  REFERRING PROVIDER:   Idelle Crouch, MD [place] [facility] [place],  [place] [address] [phone]  RESPONSIBLE PARTY:    Contact Information     Name Relation Home Work [place] Daughter [phone]     Daye,Robert  [phone]     Jerrell Belfast [phone]          I met face to face with patient and family in the home. Palliative Care was asked to follow this patient by consultation request of  Sparks, Leonie Douglas, MD to address advance care planning and complex medical decision making. This is a follow up visit.                                   ASSESSMENT AND PLAN / RECOMMENDATIONS:   Advance Care Planning/Goals of Care: Goals include to maximize quality of life and symptom management. Patient/health care surrogate gave his/her permission to discuss. Our advance care planning conversation included a discussion about:    The value and importance of advance care planning  Experiences with loved ones who have been seriously ill or have died  Exploration of personal, cultural or spiritual beliefs that might influence medical decisions  CODE STATUS: DNR  Education provided on Palliative Medicine. Will continue to provide support, symptom management as needed.   Symptom Management/Plan:  Generalized weakness-completed therapy, weakness has improved; no falls. Uses quad cane PRN.   CAD-denies shortness of breath, chest pain. Continue pravastatin, amlodipine and Plavix as directed.  Mild cognitive impairment-patient with  forgetfulness. Reorient and redirect as needed. Continue medication pill box, life alert in the home. Caregivers to help assist with adl's, cueing.   Follow up Palliative Care Visit: Palliative care will continue to follow for complex medical decision making, advance care planning, and clarification of goals. Return in 8 weeks or prn.   This visit was coded based on medical decision making (MDM).  PPS: 50%  HOSPICE ELIGIBILITY/DIAGNOSIS: TBD  Chief Complaint: Palliative Medicine follow up visit.   HISTORY OF PRESENT ILLNESS:  Teresa Wood is a 88 y.o. year old female  with CAD without angina pectoris, hypertension, hyperlipidemia, peripheral vascular disease, venous stasis, degenerative disc disease, syncope, aortic atherosclerosis, anemia, B12 deficiency, history of TIA.   Has a caregiver coming usually 3 days a week. She reports doing well. Denies pain, no shortness of breath, nausea, constipation. Good appetite endorsed; weight is stable. She is sleeping well at night. A 10-point ROS is negative, except for the pertinent positives and negatives detailed per the HPI.  History obtained from review of EMR, discussion with primary team, and interview with family, facility staff/caregiver and/or Teresa Wood.  I reviewed available labs, medications, imaging, studies and related documents from the EMR.  Records reviewed and summarized above.   Physical Exam: Pulse 68, resp 16, b/p 130/68, sats 98% on room air Constitutional: NAD General: frail appearing, thin EYES: anicteric sclera, lids intact, no discharge  ENMT: intact hearing, oral mucous membranes moist, dentition intact CV: S1S2, RRR, no  LE edema Pulmonary: LCTA, no increased work of breathing, no cough, room air Abdomen: normo-active BS + 4 quadrants, soft and non tender, no ascites GU: deferred MSK: moves all extremities, ambulatory Skin: warm and dry, no rashes or wounds on visible skin Neuro:  no generalized weakness, A & O X  3 Psych: non-anxious affect, pleasant Hem/lymph/immuno: no widespread bruising   Thank you for the opportunity to participate in the care of Teresa Wood.  The palliative care team will continue to follow. Please call our office at [phone] if we can be of additional assistance.   Ezekiel Slocumb, NP   COVID-19 PATIENT SCREENING TOOL Asked and negative response unless otherwise noted:   Have you had symptoms of covid, tested positive or been in contact with someone with symptoms/positive test in the past 5-10 days? No

## 2021-10-09 DIAGNOSIS — R413 Other amnesia: Secondary | ICD-10-CM | POA: Diagnosis not present

## 2021-10-09 DIAGNOSIS — N184 Chronic kidney disease, stage 4 (severe): Secondary | ICD-10-CM | POA: Diagnosis not present

## 2021-10-09 DIAGNOSIS — I129 Hypertensive chronic kidney disease with stage 1 through stage 4 chronic kidney disease, or unspecified chronic kidney disease: Secondary | ICD-10-CM | POA: Diagnosis not present

## 2021-10-13 DIAGNOSIS — I10 Essential (primary) hypertension: Secondary | ICD-10-CM | POA: Diagnosis not present

## 2021-10-13 DIAGNOSIS — I2510 Atherosclerotic heart disease of native coronary artery without angina pectoris: Secondary | ICD-10-CM | POA: Diagnosis not present

## 2021-10-13 DIAGNOSIS — E782 Mixed hyperlipidemia: Secondary | ICD-10-CM | POA: Diagnosis not present

## 2021-10-13 DIAGNOSIS — Z79899 Other long term (current) drug therapy: Secondary | ICD-10-CM | POA: Diagnosis not present

## 2021-10-13 DIAGNOSIS — E538 Deficiency of other specified B group vitamins: Secondary | ICD-10-CM | POA: Diagnosis not present

## 2021-11-12 DIAGNOSIS — E538 Deficiency of other specified B group vitamins: Secondary | ICD-10-CM | POA: Diagnosis not present

## 2021-11-24 ENCOUNTER — Other Ambulatory Visit: Payer: PPO | Admitting: Student

## 2021-11-24 DIAGNOSIS — Z515 Encounter for palliative care: Secondary | ICD-10-CM | POA: Diagnosis not present

## 2021-11-24 DIAGNOSIS — R630 Anorexia: Secondary | ICD-10-CM

## 2021-11-24 DIAGNOSIS — G3184 Mild cognitive impairment, so stated: Secondary | ICD-10-CM

## 2021-11-24 DIAGNOSIS — I2510 Atherosclerotic heart disease of native coronary artery without angina pectoris: Secondary | ICD-10-CM | POA: Diagnosis not present

## 2021-11-24 NOTE — Progress Notes (Signed)
  [facility] Consult Note Telephone: [phone]  Fax: [phone]    Date of encounter: 11/24/21 11:05 AM PATIENT NAME: Teresa Wood [address] [place] [address]   [phone] (home)  DOB: 11/19/1923 MRN: [id number] PRIMARY CARE PROVIDER:    Sparks, Jeffrey D, MD,  [address] [facility] [place] [address] [phone]  REFERRING PROVIDER:   Sparks, Jeffrey D, MD [address] [facility] [place],   [address] [phone]  RESPONSIBLE PARTY:    Contact Information     Name Relation Home Work Mobile   Gusler,Beth Daughter [phone]     Daye,Robert  [phone]     Ashley,Jay Nephew [phone]          I met face to face with patient and caregiver in the home. Palliative Care was asked to follow this patient by consultation request of  Sparks, Jeffrey D, MD to address advance care planning and complex medical decision making. This is a follow up visit.                                   ASSESSMENT AND PLAN / RECOMMENDATIONS:   Advance Care Planning/Goals of Care: Goals include to maximize quality of life and symptom management. Patient/health care surrogate gave his/her permission to discuss. CODE STATUS: DNR  Symptom Management/Plan:  CAD-patient stable. She denies shortness of breath, chest pain, fatigue or weakness. Continue pravastatin, amlodipine and Plavix as directed.  Appetite-patient with fair appetite; no further weight loss at this time. Encourage foods she enjoys, encourage increasing her fluid intake, keep fluids within reach.    Mild cognitive impairment-Reorient and redirect as needed. Continue medication pill box, life alert in the home. Caregivers 3 times weekly to help assist with adl's, cueing. monitor for cognitive declines.  Follow up Palliative Care Visit: Palliative care will continue to follow for complex medical decision making,  advance care planning, and clarification of goals. Return in 12 weeks or prn.  This visit was coded based on medical decision making (MDM).  PPS: 50%  HOSPICE ELIGIBILITY/DIAGNOSIS: TBD  Chief Complaint: Palliative Medicine follow up visit.   HISTORY OF PRESENT ILLNESS:  Teresa Wood is a 88 y.o. year old female  with CAD without angina pectoris, hypertension, hyperlipidemia, peripheral vascular disease, venous stasis, degenerative disc disease, syncope, aortic atherosclerosis, anemia, B12 deficiency, history of TIA.    Patient reports doing well today.  She denies having any pain, chest pain, shortness of breath, nausea or constipation.  She endorses fair appetite; expresses that she could be drinking more fluids.  Caregiver present also states patient needs to drink more fluids.  She is sleeping well at night.  She is using quad cane for ambulation; no falls reported.  A 10 point review of systems is negative, except for the pertinent negatives and positives detailed per the HPI.  History obtained from review of EMR, discussion with primary team, and interview with family, facility staff/caregiver and/or Teresa Wood.  I reviewed available labs, medications, imaging, studies and related documents from the EMR.  Records reviewed and summarized above.    Physical Exam:  Pulse 62, resp 16, b/p 102/54, sats 95% on room air Constitutional: NAD General: frail appearing EYES: anicteric sclera, lids intact, no discharge  ENMT: intact hearing, oral mucous membranes moist, dentition intact CV: S1S2, RRR, trace LE edema R > L Pulmonary: LCTA, no   increased work of breathing, no cough, room air Abdomen:  normo-active BS + 4 quadrants, soft and non tender, no ascites GU: deferred MSK: moves all extremities, ambulatory with quad cane Skin: warm and dry, no rashes or wounds on visible skin, turgor fair Neuro:  no generalized weakness, A & O x 3, forgetful Psych: non-anxious affect,  pleasant Hem/lymph/immuno: no widespread bruising   Thank you for the opportunity to participate in the care of Teresa Wood.  The palliative care team will continue to follow. Please call our office at [phone] if we can be of additional assistance.   Ezekiel Slocumb, NP   COVID-19 PATIENT SCREENING TOOL Asked and negative response unless otherwise noted:   Have you had symptoms of covid, tested positive or been in contact with someone with symptoms/positive test in the past 5-10 days? No

## 2021-12-12 DIAGNOSIS — I8390 Asymptomatic varicose veins of unspecified lower extremity: Secondary | ICD-10-CM | POA: Diagnosis not present

## 2021-12-12 DIAGNOSIS — I10 Essential (primary) hypertension: Secondary | ICD-10-CM | POA: Diagnosis not present

## 2021-12-12 DIAGNOSIS — Z515 Encounter for palliative care: Secondary | ICD-10-CM | POA: Diagnosis not present

## 2021-12-12 DIAGNOSIS — F0390 Unspecified dementia without behavioral disturbance: Secondary | ICD-10-CM | POA: Diagnosis not present

## 2021-12-17 DIAGNOSIS — N1832 Chronic kidney disease, stage 3b: Secondary | ICD-10-CM | POA: Diagnosis not present

## 2021-12-17 DIAGNOSIS — D649 Anemia, unspecified: Secondary | ICD-10-CM | POA: Diagnosis not present

## 2021-12-17 DIAGNOSIS — Z950 Presence of cardiac pacemaker: Secondary | ICD-10-CM | POA: Diagnosis not present

## 2021-12-17 DIAGNOSIS — I10 Essential (primary) hypertension: Secondary | ICD-10-CM | POA: Diagnosis not present

## 2021-12-17 DIAGNOSIS — I2510 Atherosclerotic heart disease of native coronary artery without angina pectoris: Secondary | ICD-10-CM | POA: Diagnosis not present

## 2021-12-17 DIAGNOSIS — E538 Deficiency of other specified B group vitamins: Secondary | ICD-10-CM | POA: Diagnosis not present

## 2021-12-17 DIAGNOSIS — Z79899 Other long term (current) drug therapy: Secondary | ICD-10-CM | POA: Diagnosis not present

## 2021-12-17 DIAGNOSIS — E7800 Pure hypercholesterolemia, unspecified: Secondary | ICD-10-CM | POA: Diagnosis not present

## 2022-01-06 DIAGNOSIS — H35342 Macular cyst, hole, or pseudohole, left eye: Secondary | ICD-10-CM | POA: Diagnosis not present

## 2022-01-21 DIAGNOSIS — E7800 Pure hypercholesterolemia, unspecified: Secondary | ICD-10-CM | POA: Diagnosis not present

## 2022-01-21 DIAGNOSIS — Z23 Encounter for immunization: Secondary | ICD-10-CM | POA: Diagnosis not present

## 2022-01-21 DIAGNOSIS — N1832 Chronic kidney disease, stage 3b: Secondary | ICD-10-CM | POA: Diagnosis not present

## 2022-01-21 DIAGNOSIS — Z950 Presence of cardiac pacemaker: Secondary | ICD-10-CM | POA: Diagnosis not present

## 2022-01-21 DIAGNOSIS — I2510 Atherosclerotic heart disease of native coronary artery without angina pectoris: Secondary | ICD-10-CM | POA: Diagnosis not present

## 2022-01-21 DIAGNOSIS — G459 Transient cerebral ischemic attack, unspecified: Secondary | ICD-10-CM | POA: Diagnosis not present

## 2022-01-21 DIAGNOSIS — I878 Other specified disorders of veins: Secondary | ICD-10-CM | POA: Diagnosis not present

## 2022-01-21 DIAGNOSIS — E538 Deficiency of other specified B group vitamins: Secondary | ICD-10-CM | POA: Diagnosis not present

## 2022-01-21 DIAGNOSIS — I10 Essential (primary) hypertension: Secondary | ICD-10-CM | POA: Diagnosis not present

## 2022-02-23 ENCOUNTER — Other Ambulatory Visit: Payer: PPO | Admitting: Student

## 2022-02-23 DIAGNOSIS — G3184 Mild cognitive impairment, so stated: Secondary | ICD-10-CM | POA: Diagnosis not present

## 2022-02-23 DIAGNOSIS — I2510 Atherosclerotic heart disease of native coronary artery without angina pectoris: Secondary | ICD-10-CM | POA: Diagnosis not present

## 2022-02-23 DIAGNOSIS — Z515 Encounter for palliative care: Secondary | ICD-10-CM | POA: Diagnosis not present

## 2022-02-23 NOTE — Progress Notes (Signed)
[facility] Consult Note Telephone: [phone]  Fax: [phone]    Date of encounter: 02/23/22 11:10 AM PATIENT NAME: Teresa Wood [address] [place] [address]   [phone] (home)  DOB: 1924-01-18 MRN: [id number] PRIMARY CARE PROVIDER:    Idelle Crouch, MD,  [address] [place] [address] [phone]  REFERRING PROVIDER:   Idelle Crouch, MD [place] [facility] [place],  [place] [address] [phone]  RESPONSIBLE PARTY:    Contact Information     Name Relation Home Work [place] Daughter [phone]     Daye,Robert  [phone]     Jerrell Belfast [phone]          I met face to face with patient in the home. Palliative Care was asked to follow this patient by consultation request of  Sparks, Leonie Douglas, MD to address advance care planning and complex medical decision making. This is a follow up visit.                                   ASSESSMENT AND PLAN / RECOMMENDATIONS:   Advance Care Planning/Goals of Care: Goals include to maximize quality of life and symptom management. Patient/health care surrogate gave his/her permission to discuss. CODE STATUS: DNR  Education provided on Palliative Medicine.   Symptom Management/Plan:  CAD-patient has been stable; she does endorse occasional shortness of breath with exertion. Otherwise, she denies chest pain, fatigue, weakness. Continue  pravastatin, amlodipine and Plavix as directed.  Mild cognitive impairment-Reorient and redirect as needed. Continue medication pill box, life alert in the home. Caregivers 2 x weekly to help assist with adl's and cueing. Monitor for further cognitive declines. Patient wishes to remain in the home.   Follow up Palliative Care Visit: Palliative care will continue to follow for complex medical decision making, advance care planning, and  clarification of goals. Return prn.  This visit was coded based on medical decision making (MDM).  PPS: 50%  HOSPICE ELIGIBILITY/DIAGNOSIS: TBD  Chief Complaint: Palliative Medicine follow up.   HISTORY OF PRESENT ILLNESS:  Teresa Wood is a 88 y.o. year old female  with CAD without angina pectoris, hypertension, hyperlipidemia, peripheral vascular disease, venous stasis, degenerative disc disease, syncope, aortic atherosclerosis, anemia, B12 deficiency, history of TIA.  Patient reports doing well. Denies pain, chest pain, nausea, constipation. She endorses occasional shortness of breath with exertion. Her appetite has been fair; no weight loss reported. Has caregiver coming in twice a week. She is going out for hair appointments and to grocery store. No recent infections, ED visits or hospitalizations.   History obtained from review of EMR, discussion with primary team, and interview with family, facility staff/caregiver and/or Teresa Wood.  I reviewed available labs, medications, imaging, studies and related documents from the EMR.  Records reviewed and summarized above.   ROS   A 10 point ROS is negative, except for the pertinent negatives and positives detailed per the HPI.  Physical Exam: Pulse 62, resp 20, b/p 118/60, sats 97% on room air Constitutional: NAD General: frail appearing EYES: anicteric sclera, lids intact, no discharge  ENMT: intact hearing, oral mucous membranes moist, dentition intact CV: S1S2, RRR, no LE edema Pulmonary: LCTA, no increased work of breathing, no cough, room air Abdomen: normo-active BS + 4 quadrants, soft and non tender GU: deferred MSK: moves  all extremities, ambulatory with cane Skin: warm and dry, no rashes or wounds on visible skin Neuro:  + generalized weakness Psych: non-anxious affect, A and O x 3, some forgetfulness Hem/lymph/immuno: no widespread bruising   Thank you for the opportunity to participate in the care of Teresa Wood.   The palliative care team will continue to follow. Please call our office at [phone] if we can be of additional assistance.   Ezekiel Slocumb, NP   COVID-19 PATIENT SCREENING TOOL Asked and negative response unless otherwise noted:   Have you had symptoms of covid, tested positive or been in contact with someone with symptoms/positive test in the past 5-10 days? No

## 2022-02-25 DIAGNOSIS — E538 Deficiency of other specified B group vitamins: Secondary | ICD-10-CM | POA: Diagnosis not present

## 2022-03-09 DIAGNOSIS — I10 Essential (primary) hypertension: Secondary | ICD-10-CM | POA: Diagnosis not present

## 2022-03-09 DIAGNOSIS — Z950 Presence of cardiac pacemaker: Secondary | ICD-10-CM | POA: Diagnosis not present

## 2022-03-09 DIAGNOSIS — Z515 Encounter for palliative care: Secondary | ICD-10-CM | POA: Diagnosis not present

## 2022-03-09 DIAGNOSIS — E785 Hyperlipidemia, unspecified: Secondary | ICD-10-CM | POA: Diagnosis not present

## 2022-03-09 DIAGNOSIS — I495 Sick sinus syndrome: Secondary | ICD-10-CM | POA: Diagnosis not present

## 2022-04-01 DIAGNOSIS — I2510 Atherosclerotic heart disease of native coronary artery without angina pectoris: Secondary | ICD-10-CM | POA: Diagnosis not present

## 2022-04-01 DIAGNOSIS — R8290 Unspecified abnormal findings in urine: Secondary | ICD-10-CM | POA: Diagnosis not present

## 2022-04-01 DIAGNOSIS — N1832 Chronic kidney disease, stage 3b: Secondary | ICD-10-CM | POA: Diagnosis not present

## 2022-04-01 DIAGNOSIS — R7309 Other abnormal glucose: Secondary | ICD-10-CM | POA: Diagnosis not present

## 2022-04-01 DIAGNOSIS — I10 Essential (primary) hypertension: Secondary | ICD-10-CM | POA: Diagnosis not present

## 2022-04-01 DIAGNOSIS — Z23 Encounter for immunization: Secondary | ICD-10-CM | POA: Diagnosis not present

## 2022-04-01 DIAGNOSIS — Z79899 Other long term (current) drug therapy: Secondary | ICD-10-CM | POA: Diagnosis not present

## 2022-04-01 DIAGNOSIS — K219 Gastro-esophageal reflux disease without esophagitis: Secondary | ICD-10-CM | POA: Diagnosis not present

## 2022-04-01 DIAGNOSIS — E7800 Pure hypercholesterolemia, unspecified: Secondary | ICD-10-CM | POA: Diagnosis not present

## 2022-04-01 DIAGNOSIS — E538 Deficiency of other specified B group vitamins: Secondary | ICD-10-CM | POA: Diagnosis not present

## 2022-04-21 DIAGNOSIS — R001 Bradycardia, unspecified: Secondary | ICD-10-CM | POA: Diagnosis not present

## 2022-05-06 DIAGNOSIS — Z79899 Other long term (current) drug therapy: Secondary | ICD-10-CM | POA: Diagnosis not present

## 2022-05-06 DIAGNOSIS — N1832 Chronic kidney disease, stage 3b: Secondary | ICD-10-CM | POA: Diagnosis not present

## 2022-05-06 DIAGNOSIS — E538 Deficiency of other specified B group vitamins: Secondary | ICD-10-CM | POA: Diagnosis not present

## 2022-05-12 DIAGNOSIS — N184 Chronic kidney disease, stage 4 (severe): Secondary | ICD-10-CM | POA: Diagnosis not present

## 2022-05-12 DIAGNOSIS — Z79899 Other long term (current) drug therapy: Secondary | ICD-10-CM | POA: Diagnosis not present

## 2022-05-12 DIAGNOSIS — R7303 Prediabetes: Secondary | ICD-10-CM | POA: Diagnosis not present

## 2022-05-12 DIAGNOSIS — I10 Essential (primary) hypertension: Secondary | ICD-10-CM | POA: Diagnosis not present

## 2022-05-12 DIAGNOSIS — D649 Anemia, unspecified: Secondary | ICD-10-CM | POA: Diagnosis not present

## 2022-05-12 DIAGNOSIS — N1832 Chronic kidney disease, stage 3b: Secondary | ICD-10-CM | POA: Diagnosis not present

## 2022-05-12 DIAGNOSIS — E538 Deficiency of other specified B group vitamins: Secondary | ICD-10-CM | POA: Diagnosis not present

## 2022-06-10 DIAGNOSIS — E538 Deficiency of other specified B group vitamins: Secondary | ICD-10-CM | POA: Diagnosis not present

## 2022-06-10 DIAGNOSIS — D649 Anemia, unspecified: Secondary | ICD-10-CM | POA: Diagnosis not present

## 2022-06-10 DIAGNOSIS — I10 Essential (primary) hypertension: Secondary | ICD-10-CM | POA: Diagnosis not present

## 2022-06-10 DIAGNOSIS — Z79899 Other long term (current) drug therapy: Secondary | ICD-10-CM | POA: Diagnosis not present

## 2022-07-15 DIAGNOSIS — E7800 Pure hypercholesterolemia, unspecified: Secondary | ICD-10-CM | POA: Diagnosis not present

## 2022-07-15 DIAGNOSIS — R7303 Prediabetes: Secondary | ICD-10-CM | POA: Diagnosis not present

## 2022-07-15 DIAGNOSIS — N1832 Chronic kidney disease, stage 3b: Secondary | ICD-10-CM | POA: Diagnosis not present

## 2022-07-15 DIAGNOSIS — I2510 Atherosclerotic heart disease of native coronary artery without angina pectoris: Secondary | ICD-10-CM | POA: Diagnosis not present

## 2022-07-15 DIAGNOSIS — Z79899 Other long term (current) drug therapy: Secondary | ICD-10-CM | POA: Diagnosis not present

## 2022-07-15 DIAGNOSIS — Z0000 Encounter for general adult medical examination without abnormal findings: Secondary | ICD-10-CM | POA: Diagnosis not present

## 2022-07-15 DIAGNOSIS — I10 Essential (primary) hypertension: Secondary | ICD-10-CM | POA: Diagnosis not present

## 2022-07-15 DIAGNOSIS — E538 Deficiency of other specified B group vitamins: Secondary | ICD-10-CM | POA: Diagnosis not present

## 2022-07-17 DIAGNOSIS — I10 Essential (primary) hypertension: Secondary | ICD-10-CM | POA: Diagnosis not present

## 2022-07-29 DIAGNOSIS — I10 Essential (primary) hypertension: Secondary | ICD-10-CM | POA: Diagnosis not present

## 2022-07-29 DIAGNOSIS — I2510 Atherosclerotic heart disease of native coronary artery without angina pectoris: Secondary | ICD-10-CM | POA: Diagnosis not present

## 2022-07-29 DIAGNOSIS — I4891 Unspecified atrial fibrillation: Secondary | ICD-10-CM | POA: Diagnosis not present

## 2022-07-29 DIAGNOSIS — R001 Bradycardia, unspecified: Secondary | ICD-10-CM | POA: Diagnosis not present

## 2022-07-29 DIAGNOSIS — E7800 Pure hypercholesterolemia, unspecified: Secondary | ICD-10-CM | POA: Diagnosis not present

## 2022-07-29 DIAGNOSIS — Z950 Presence of cardiac pacemaker: Secondary | ICD-10-CM | POA: Diagnosis not present

## 2022-08-19 DIAGNOSIS — E538 Deficiency of other specified B group vitamins: Secondary | ICD-10-CM | POA: Diagnosis not present

## 2022-09-23 DIAGNOSIS — E538 Deficiency of other specified B group vitamins: Secondary | ICD-10-CM | POA: Diagnosis not present

## 2022-10-20 DIAGNOSIS — I878 Other specified disorders of veins: Secondary | ICD-10-CM | POA: Diagnosis not present

## 2022-10-20 DIAGNOSIS — I2510 Atherosclerotic heart disease of native coronary artery without angina pectoris: Secondary | ICD-10-CM | POA: Diagnosis not present

## 2022-10-20 DIAGNOSIS — R001 Bradycardia, unspecified: Secondary | ICD-10-CM | POA: Diagnosis not present

## 2022-10-20 DIAGNOSIS — Z950 Presence of cardiac pacemaker: Secondary | ICD-10-CM | POA: Diagnosis not present

## 2022-10-28 DIAGNOSIS — Z79899 Other long term (current) drug therapy: Secondary | ICD-10-CM | POA: Diagnosis not present

## 2022-10-28 DIAGNOSIS — I10 Essential (primary) hypertension: Secondary | ICD-10-CM | POA: Diagnosis not present

## 2022-10-28 DIAGNOSIS — N1832 Chronic kidney disease, stage 3b: Secondary | ICD-10-CM | POA: Diagnosis not present

## 2022-10-28 DIAGNOSIS — E538 Deficiency of other specified B group vitamins: Secondary | ICD-10-CM | POA: Diagnosis not present

## 2022-10-28 DIAGNOSIS — R7303 Prediabetes: Secondary | ICD-10-CM | POA: Diagnosis not present

## 2022-10-28 DIAGNOSIS — Z950 Presence of cardiac pacemaker: Secondary | ICD-10-CM | POA: Diagnosis not present

## 2022-10-28 DIAGNOSIS — R8290 Unspecified abnormal findings in urine: Secondary | ICD-10-CM | POA: Diagnosis not present

## 2022-10-28 DIAGNOSIS — I4891 Unspecified atrial fibrillation: Secondary | ICD-10-CM | POA: Diagnosis not present

## 2022-10-28 DIAGNOSIS — E7800 Pure hypercholesterolemia, unspecified: Secondary | ICD-10-CM | POA: Diagnosis not present

## 2022-10-28 DIAGNOSIS — Z0000 Encounter for general adult medical examination without abnormal findings: Secondary | ICD-10-CM | POA: Diagnosis not present

## 2022-10-29 DIAGNOSIS — R8290 Unspecified abnormal findings in urine: Secondary | ICD-10-CM | POA: Diagnosis not present

## 2022-11-21 DIAGNOSIS — M7989 Other specified soft tissue disorders: Secondary | ICD-10-CM | POA: Diagnosis not present

## 2022-11-27 DIAGNOSIS — M7989 Other specified soft tissue disorders: Secondary | ICD-10-CM | POA: Diagnosis not present

## 2022-12-02 DIAGNOSIS — L989 Disorder of the skin and subcutaneous tissue, unspecified: Secondary | ICD-10-CM | POA: Diagnosis not present

## 2022-12-02 DIAGNOSIS — E538 Deficiency of other specified B group vitamins: Secondary | ICD-10-CM | POA: Diagnosis not present

## 2022-12-07 DIAGNOSIS — L989 Disorder of the skin and subcutaneous tissue, unspecified: Secondary | ICD-10-CM | POA: Diagnosis not present

## 2023-01-06 DIAGNOSIS — E538 Deficiency of other specified B group vitamins: Secondary | ICD-10-CM | POA: Diagnosis not present

## 2023-01-13 DIAGNOSIS — N189 Chronic kidney disease, unspecified: Secondary | ICD-10-CM | POA: Diagnosis not present

## 2023-01-13 DIAGNOSIS — U071 COVID-19: Secondary | ICD-10-CM | POA: Diagnosis not present

## 2023-01-15 DIAGNOSIS — R413 Other amnesia: Secondary | ICD-10-CM | POA: Diagnosis not present

## 2023-01-15 DIAGNOSIS — U071 COVID-19: Secondary | ICD-10-CM | POA: Diagnosis not present

## 2023-01-15 DIAGNOSIS — I10 Essential (primary) hypertension: Secondary | ICD-10-CM | POA: Diagnosis not present

## 2023-02-02 DIAGNOSIS — Z961 Presence of intraocular lens: Secondary | ICD-10-CM | POA: Diagnosis not present

## 2023-02-02 DIAGNOSIS — H35372 Puckering of macula, left eye: Secondary | ICD-10-CM | POA: Diagnosis not present

## 2023-02-02 DIAGNOSIS — H35342 Macular cyst, hole, or pseudohole, left eye: Secondary | ICD-10-CM | POA: Diagnosis not present

## 2023-02-03 DIAGNOSIS — I495 Sick sinus syndrome: Secondary | ICD-10-CM | POA: Diagnosis not present

## 2023-02-03 DIAGNOSIS — Z950 Presence of cardiac pacemaker: Secondary | ICD-10-CM | POA: Diagnosis not present

## 2023-02-03 DIAGNOSIS — I4891 Unspecified atrial fibrillation: Secondary | ICD-10-CM | POA: Diagnosis not present

## 2023-02-03 DIAGNOSIS — I10 Essential (primary) hypertension: Secondary | ICD-10-CM | POA: Diagnosis not present

## 2023-02-03 DIAGNOSIS — R001 Bradycardia, unspecified: Secondary | ICD-10-CM | POA: Diagnosis not present

## 2023-02-03 DIAGNOSIS — E7800 Pure hypercholesterolemia, unspecified: Secondary | ICD-10-CM | POA: Diagnosis not present

## 2023-02-03 DIAGNOSIS — I2510 Atherosclerotic heart disease of native coronary artery without angina pectoris: Secondary | ICD-10-CM | POA: Diagnosis not present

## 2023-02-03 DIAGNOSIS — N1832 Chronic kidney disease, stage 3b: Secondary | ICD-10-CM | POA: Diagnosis not present

## 2023-02-03 DIAGNOSIS — I878 Other specified disorders of veins: Secondary | ICD-10-CM | POA: Diagnosis not present

## 2023-02-07 DIAGNOSIS — R519 Headache, unspecified: Secondary | ICD-10-CM | POA: Diagnosis not present

## 2023-02-07 DIAGNOSIS — H6121 Impacted cerumen, right ear: Secondary | ICD-10-CM | POA: Diagnosis not present

## 2023-02-07 DIAGNOSIS — I129 Hypertensive chronic kidney disease with stage 1 through stage 4 chronic kidney disease, or unspecified chronic kidney disease: Secondary | ICD-10-CM | POA: Diagnosis not present

## 2023-02-07 DIAGNOSIS — H938X2 Other specified disorders of left ear: Secondary | ICD-10-CM | POA: Diagnosis not present

## 2023-02-10 DIAGNOSIS — I10 Essential (primary) hypertension: Secondary | ICD-10-CM | POA: Diagnosis not present

## 2023-02-10 DIAGNOSIS — Z23 Encounter for immunization: Secondary | ICD-10-CM | POA: Diagnosis not present

## 2023-02-10 DIAGNOSIS — N1832 Chronic kidney disease, stage 3b: Secondary | ICD-10-CM | POA: Diagnosis not present

## 2023-02-10 DIAGNOSIS — I4891 Unspecified atrial fibrillation: Secondary | ICD-10-CM | POA: Diagnosis not present

## 2023-02-10 DIAGNOSIS — E782 Mixed hyperlipidemia: Secondary | ICD-10-CM | POA: Diagnosis not present

## 2023-02-10 DIAGNOSIS — R7303 Prediabetes: Secondary | ICD-10-CM | POA: Diagnosis not present

## 2023-02-10 DIAGNOSIS — E538 Deficiency of other specified B group vitamins: Secondary | ICD-10-CM | POA: Diagnosis not present

## 2023-02-10 DIAGNOSIS — Z79899 Other long term (current) drug therapy: Secondary | ICD-10-CM | POA: Diagnosis not present

## 2023-02-11 DIAGNOSIS — Z8669 Personal history of other diseases of the nervous system and sense organs: Secondary | ICD-10-CM | POA: Diagnosis not present

## 2023-02-11 DIAGNOSIS — H6120 Impacted cerumen, unspecified ear: Secondary | ICD-10-CM | POA: Diagnosis not present

## 2023-03-17 DIAGNOSIS — E538 Deficiency of other specified B group vitamins: Secondary | ICD-10-CM | POA: Diagnosis not present

## 2023-04-05 DIAGNOSIS — I4891 Unspecified atrial fibrillation: Secondary | ICD-10-CM | POA: Diagnosis not present

## 2023-04-05 DIAGNOSIS — I10 Essential (primary) hypertension: Secondary | ICD-10-CM | POA: Diagnosis not present

## 2023-04-05 DIAGNOSIS — E7800 Pure hypercholesterolemia, unspecified: Secondary | ICD-10-CM | POA: Diagnosis not present

## 2023-04-05 DIAGNOSIS — I2510 Atherosclerotic heart disease of native coronary artery without angina pectoris: Secondary | ICD-10-CM | POA: Diagnosis not present

## 2023-04-05 DIAGNOSIS — Z950 Presence of cardiac pacemaker: Secondary | ICD-10-CM | POA: Diagnosis not present

## 2023-04-05 DIAGNOSIS — N1832 Chronic kidney disease, stage 3b: Secondary | ICD-10-CM | POA: Diagnosis not present

## 2023-04-06 DIAGNOSIS — N1832 Chronic kidney disease, stage 3b: Secondary | ICD-10-CM | POA: Diagnosis not present

## 2023-04-06 DIAGNOSIS — I129 Hypertensive chronic kidney disease with stage 1 through stage 4 chronic kidney disease, or unspecified chronic kidney disease: Secondary | ICD-10-CM | POA: Diagnosis not present

## 2023-04-06 DIAGNOSIS — Z66 Do not resuscitate: Secondary | ICD-10-CM | POA: Diagnosis not present

## 2023-04-06 DIAGNOSIS — Z6821 Body mass index (BMI) 21.0-21.9, adult: Secondary | ICD-10-CM | POA: Diagnosis not present

## 2023-04-06 DIAGNOSIS — Z515 Encounter for palliative care: Secondary | ICD-10-CM | POA: Diagnosis not present

## 2023-04-06 DIAGNOSIS — R413 Other amnesia: Secondary | ICD-10-CM | POA: Diagnosis not present

## 2023-04-21 DIAGNOSIS — E538 Deficiency of other specified B group vitamins: Secondary | ICD-10-CM | POA: Diagnosis not present

## 2023-05-31 DIAGNOSIS — Z0000 Encounter for general adult medical examination without abnormal findings: Secondary | ICD-10-CM | POA: Diagnosis not present

## 2023-05-31 DIAGNOSIS — R7303 Prediabetes: Secondary | ICD-10-CM | POA: Diagnosis not present

## 2023-05-31 DIAGNOSIS — R0602 Shortness of breath: Secondary | ICD-10-CM | POA: Diagnosis not present

## 2023-05-31 DIAGNOSIS — I4891 Unspecified atrial fibrillation: Secondary | ICD-10-CM | POA: Diagnosis not present

## 2023-05-31 DIAGNOSIS — E7800 Pure hypercholesterolemia, unspecified: Secondary | ICD-10-CM | POA: Diagnosis not present

## 2023-05-31 DIAGNOSIS — N1832 Chronic kidney disease, stage 3b: Secondary | ICD-10-CM | POA: Diagnosis not present

## 2023-05-31 DIAGNOSIS — Z79899 Other long term (current) drug therapy: Secondary | ICD-10-CM | POA: Diagnosis not present

## 2023-05-31 DIAGNOSIS — E538 Deficiency of other specified B group vitamins: Secondary | ICD-10-CM | POA: Diagnosis not present

## 2023-05-31 DIAGNOSIS — I10 Essential (primary) hypertension: Secondary | ICD-10-CM | POA: Diagnosis not present

## 2023-06-09 DIAGNOSIS — M549 Dorsalgia, unspecified: Secondary | ICD-10-CM | POA: Diagnosis not present

## 2023-06-09 DIAGNOSIS — R1012 Left upper quadrant pain: Secondary | ICD-10-CM | POA: Diagnosis not present

## 2023-06-09 DIAGNOSIS — R109 Unspecified abdominal pain: Secondary | ICD-10-CM | POA: Diagnosis not present

## 2023-06-09 DIAGNOSIS — M5489 Other dorsalgia: Secondary | ICD-10-CM | POA: Diagnosis not present

## 2023-06-15 DIAGNOSIS — I495 Sick sinus syndrome: Secondary | ICD-10-CM | POA: Diagnosis not present

## 2023-07-07 DIAGNOSIS — E538 Deficiency of other specified B group vitamins: Secondary | ICD-10-CM | POA: Diagnosis not present

## 2023-08-04 DIAGNOSIS — I4891 Unspecified atrial fibrillation: Secondary | ICD-10-CM | POA: Diagnosis not present

## 2023-08-04 DIAGNOSIS — I10 Essential (primary) hypertension: Secondary | ICD-10-CM | POA: Diagnosis not present

## 2023-08-04 DIAGNOSIS — Z950 Presence of cardiac pacemaker: Secondary | ICD-10-CM | POA: Diagnosis not present

## 2023-08-04 DIAGNOSIS — I2510 Atherosclerotic heart disease of native coronary artery without angina pectoris: Secondary | ICD-10-CM | POA: Diagnosis not present

## 2023-08-04 DIAGNOSIS — G459 Transient cerebral ischemic attack, unspecified: Secondary | ICD-10-CM | POA: Diagnosis not present

## 2023-08-17 DIAGNOSIS — Z79899 Other long term (current) drug therapy: Secondary | ICD-10-CM | POA: Diagnosis not present

## 2023-08-17 DIAGNOSIS — I4891 Unspecified atrial fibrillation: Secondary | ICD-10-CM | POA: Diagnosis not present

## 2023-08-17 DIAGNOSIS — I10 Essential (primary) hypertension: Secondary | ICD-10-CM | POA: Diagnosis not present

## 2023-08-17 DIAGNOSIS — E538 Deficiency of other specified B group vitamins: Secondary | ICD-10-CM | POA: Diagnosis not present

## 2023-08-17 DIAGNOSIS — R7303 Prediabetes: Secondary | ICD-10-CM | POA: Diagnosis not present

## 2023-08-17 DIAGNOSIS — E782 Mixed hyperlipidemia: Secondary | ICD-10-CM | POA: Diagnosis not present

## 2023-08-17 DIAGNOSIS — N1832 Chronic kidney disease, stage 3b: Secondary | ICD-10-CM | POA: Diagnosis not present

## 2023-08-31 DIAGNOSIS — I4891 Unspecified atrial fibrillation: Secondary | ICD-10-CM | POA: Diagnosis not present

## 2023-08-31 DIAGNOSIS — E7800 Pure hypercholesterolemia, unspecified: Secondary | ICD-10-CM | POA: Diagnosis not present

## 2023-08-31 DIAGNOSIS — I2510 Atherosclerotic heart disease of native coronary artery without angina pectoris: Secondary | ICD-10-CM | POA: Diagnosis not present

## 2023-08-31 DIAGNOSIS — I878 Other specified disorders of veins: Secondary | ICD-10-CM | POA: Diagnosis not present

## 2023-08-31 DIAGNOSIS — I10 Essential (primary) hypertension: Secondary | ICD-10-CM | POA: Diagnosis not present

## 2023-08-31 DIAGNOSIS — G459 Transient cerebral ischemic attack, unspecified: Secondary | ICD-10-CM | POA: Diagnosis not present

## 2023-08-31 DIAGNOSIS — Z950 Presence of cardiac pacemaker: Secondary | ICD-10-CM | POA: Diagnosis not present

## 2023-09-27 DIAGNOSIS — E538 Deficiency of other specified B group vitamins: Secondary | ICD-10-CM | POA: Diagnosis not present

## 2023-10-29 DIAGNOSIS — E538 Deficiency of other specified B group vitamins: Secondary | ICD-10-CM | POA: Diagnosis not present

## 2023-12-01 DIAGNOSIS — R7303 Prediabetes: Secondary | ICD-10-CM | POA: Diagnosis not present

## 2023-12-01 DIAGNOSIS — I10 Essential (primary) hypertension: Secondary | ICD-10-CM | POA: Diagnosis not present

## 2023-12-01 DIAGNOSIS — I4891 Unspecified atrial fibrillation: Secondary | ICD-10-CM | POA: Diagnosis not present

## 2023-12-01 DIAGNOSIS — Z1331 Encounter for screening for depression: Secondary | ICD-10-CM | POA: Diagnosis not present

## 2023-12-01 DIAGNOSIS — E538 Deficiency of other specified B group vitamins: Secondary | ICD-10-CM | POA: Diagnosis not present

## 2023-12-01 DIAGNOSIS — Z0000 Encounter for general adult medical examination without abnormal findings: Secondary | ICD-10-CM | POA: Diagnosis not present

## 2023-12-01 DIAGNOSIS — E782 Mixed hyperlipidemia: Secondary | ICD-10-CM | POA: Diagnosis not present

## 2023-12-01 DIAGNOSIS — Z950 Presence of cardiac pacemaker: Secondary | ICD-10-CM | POA: Diagnosis not present

## 2023-12-01 DIAGNOSIS — N1832 Chronic kidney disease, stage 3b: Secondary | ICD-10-CM | POA: Diagnosis not present

## 2023-12-01 DIAGNOSIS — Z79899 Other long term (current) drug therapy: Secondary | ICD-10-CM | POA: Diagnosis not present

## 2024-01-03 DIAGNOSIS — E538 Deficiency of other specified B group vitamins: Secondary | ICD-10-CM | POA: Diagnosis not present

## 2024-01-05 DIAGNOSIS — Z950 Presence of cardiac pacemaker: Secondary | ICD-10-CM | POA: Diagnosis not present

## 2024-01-05 DIAGNOSIS — I495 Sick sinus syndrome: Secondary | ICD-10-CM | POA: Diagnosis not present

## 2024-02-01 IMAGING — DX DG CHEST 1V PORT
1 series · 1 of 1 positions shown · non-contrast
Comparison: Portable exam 9929 hours compared to 01/16/2021

CLINICAL DATA: Weakness, lethargy, hypertension

EXAM:
PORTABLE CHEST 1 VIEW

[chest ap]
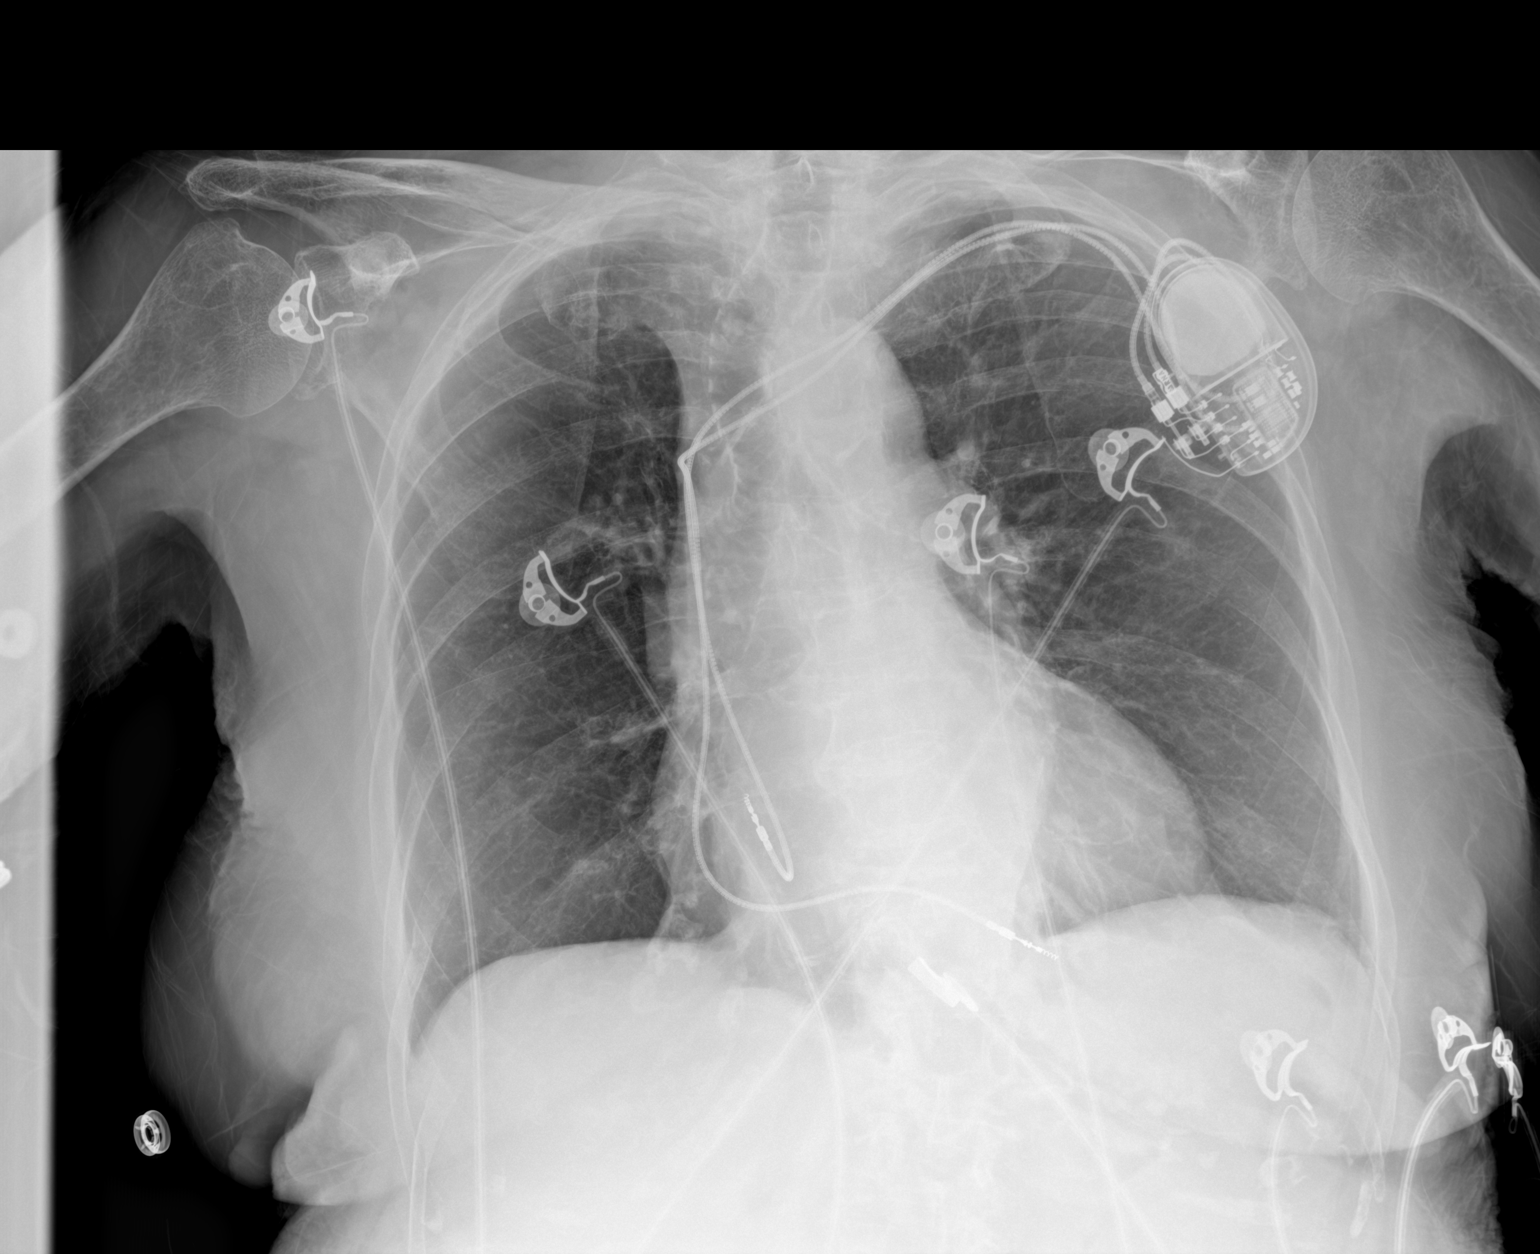

[1 of 1 positions shown; findings below may reference images not displayed]

FINDINGS: LEFT subclavian sequential pacemaker leads project over RIGHT atrium
and RIGHT ventricle unchanged.

Normal heart size, mediastinal contours, and pulmonary vascularity.

Hiatal hernia seen on previous exam not well visualized.

Atherosclerotic calcification and tortuosity of thoracic aorta
noted.

Minimal chronic LEFT basilar atelectasis versus scarring.

No acute infiltrate, pleural effusion, or pneumothorax.

Bones demineralized.
IMPRESSION: Minimal LEFT basilar atelectasis versus scarring.

No acute abnormalities.

Aortic Atherosclerosis ([id number]).

## 2024-02-09 DIAGNOSIS — Z961 Presence of intraocular lens: Secondary | ICD-10-CM | POA: Diagnosis not present

## 2024-03-08 DIAGNOSIS — Z23 Encounter for immunization: Secondary | ICD-10-CM | POA: Diagnosis not present

## 2024-03-08 DIAGNOSIS — E782 Mixed hyperlipidemia: Secondary | ICD-10-CM | POA: Diagnosis not present

## 2024-03-08 DIAGNOSIS — N1832 Chronic kidney disease, stage 3b: Secondary | ICD-10-CM | POA: Diagnosis not present

## 2024-03-08 DIAGNOSIS — I10 Essential (primary) hypertension: Secondary | ICD-10-CM | POA: Diagnosis not present

## 2024-03-08 DIAGNOSIS — E538 Deficiency of other specified B group vitamins: Secondary | ICD-10-CM | POA: Diagnosis not present

## 2024-03-08 DIAGNOSIS — I4891 Unspecified atrial fibrillation: Secondary | ICD-10-CM | POA: Diagnosis not present

## 2024-03-08 DIAGNOSIS — Z79899 Other long term (current) drug therapy: Secondary | ICD-10-CM | POA: Diagnosis not present

## 2024-03-08 DIAGNOSIS — R7303 Prediabetes: Secondary | ICD-10-CM | POA: Diagnosis not present
